# Patient Record
Sex: Female | Born: 1970 | Race: Black or African American | Hispanic: No | Marital: Single | Smoking: Current every day smoker
Health system: Southern US, Community
[De-identification: ages and names within clinical notes are randomized; demographics above are authoritative.]

## PROBLEM LIST (undated history)

## (undated) DIAGNOSIS — F329 Major depressive disorder, single episode, unspecified: Secondary | ICD-10-CM

## (undated) DIAGNOSIS — Z9189 Other specified personal risk factors, not elsewhere classified: Secondary | ICD-10-CM

## (undated) DIAGNOSIS — Q219 Congenital malformation of cardiac septum, unspecified: Secondary | ICD-10-CM

## (undated) DIAGNOSIS — F419 Anxiety disorder, unspecified: Secondary | ICD-10-CM

## (undated) DIAGNOSIS — F32A Depression, unspecified: Secondary | ICD-10-CM

## (undated) DIAGNOSIS — Z803 Family history of malignant neoplasm of breast: Secondary | ICD-10-CM

## (undated) DIAGNOSIS — K219 Gastro-esophageal reflux disease without esophagitis: Secondary | ICD-10-CM

## (undated) DIAGNOSIS — Z8041 Family history of malignant neoplasm of ovary: Secondary | ICD-10-CM

## (undated) DIAGNOSIS — Z1371 Encounter for nonprocreative screening for genetic disease carrier status: Secondary | ICD-10-CM

## (undated) DIAGNOSIS — D649 Anemia, unspecified: Secondary | ICD-10-CM

## (undated) DIAGNOSIS — IMO0001 Reserved for inherently not codable concepts without codable children: Secondary | ICD-10-CM

## (undated) DIAGNOSIS — G43909 Migraine, unspecified, not intractable, without status migrainosus: Secondary | ICD-10-CM

## (undated) HISTORY — PX: ENDOMETRIAL ABLATION: SHX621

## (undated) HISTORY — DX: Family history of malignant neoplasm of breast: Z80.3

## (undated) HISTORY — DX: Family history of malignant neoplasm of ovary: Z80.41

## (undated) HISTORY — DX: Anxiety disorder, unspecified: F41.9

## (undated) HISTORY — DX: Encounter for nonprocreative screening for genetic disease carrier status: Z13.71

## (undated) HISTORY — DX: Other specified personal risk factors, not elsewhere classified: Z91.89

## (undated) HISTORY — DX: Depression, unspecified: F32.A

## (undated) HISTORY — DX: Migraine, unspecified, not intractable, without status migrainosus: G43.909

## (undated) HISTORY — DX: Major depressive disorder, single episode, unspecified: F32.9

## (undated) HISTORY — DX: Congenital malformation of cardiac septum, unspecified: Q21.9

---

## 2005-08-09 HISTORY — PX: TUBAL LIGATION: SHX77

## 2006-04-28 ENCOUNTER — Ambulatory Visit: Payer: Self-pay | Admitting: Obstetrics & Gynecology

## 2008-04-03 ENCOUNTER — Ambulatory Visit: Payer: Self-pay | Admitting: Obstetrics & Gynecology

## 2010-07-22 ENCOUNTER — Emergency Department: Payer: Self-pay | Admitting: Emergency Medicine

## 2014-02-25 LAB — HM PAP SMEAR: HM PAP: ABNORMAL

## 2014-03-13 ENCOUNTER — Ambulatory Visit: Payer: Self-pay | Admitting: Obstetrics & Gynecology

## 2014-03-13 LAB — HM MAMMOGRAPHY: HM Mammogram: ABNORMAL — AB (ref 0–4)

## 2014-03-18 ENCOUNTER — Ambulatory Visit (INDEPENDENT_AMBULATORY_CARE_PROVIDER_SITE_OTHER): Payer: Medicaid Other | Admitting: General Surgery

## 2014-03-18 ENCOUNTER — Encounter: Payer: Self-pay | Admitting: General Surgery

## 2014-03-18 VITALS — BP 110/70 | HR 80 | Resp 12 | Ht 67.0 in | Wt 136.0 lb

## 2014-03-18 DIAGNOSIS — R920 Mammographic microcalcification found on diagnostic imaging of breast: Secondary | ICD-10-CM

## 2014-03-18 NOTE — Patient Instructions (Addendum)
Patient to be scheduled for left breast stereotactic breast biopsy. The patient is aware to call back for any questions or concerns.  Stereotactic Breast Biopsy A stereotactic breast biopsy is a procedure in which mammography is used in the collection of a sample of breast tissue. Mammography is a type of X-ray exam of the breasts that produces an image called a mammogram. The mammogram allows your health care provider to precisely locate the area of the breast from which a tissue sample will be taken. The tissue is then examined under a microscope to see if cancerous cells are present. A breast biopsy is done when:   A lump, abnormality, or mass is seen in the breast on a breast X-ray (mammogram).   Small calcium deposits (calcifications) are seen in the breast.   The shape or appearance of the breasts changes.   The shape or appearance of the nipples changes. You may have unusual or bloody discharge coming from the nipples, or you may have crusting, retraction, or dimpling of the nipples. A breast biopsy can indicate if you need surgery or other treatment.  LET [facility] CARE PROVIDER KNOW ABOUT:  Any allergies you have.  All medicines you are taking, including vitamins, herbs, eye drops, creams, and over-the-counter medicines.  Previous problems you or members of your family have had with the use of anesthetics.  Any blood disorders you have.  Previous surgeries you have had.  Medical conditions you have. RISKS AND COMPLICATIONS Generally, stereotactic breast biopsy is a safe procedure. However, as with any procedure, complications can occur. Possible complications include:  Infection at the needle-insertion site.   Bleeding or bruising after surgery.  The breast may become altered or deformed as a result of the procedure.  The needle may go through the chest wall into the lung area.  BEFORE THE PROCEDURE  Wear a supportive bra to the procedure.  You will be asked  to remove jewelry, dentures, eyeglasses, metal objects, or clothing that might interfere with the X-ray images. You may want to leave some of these objects at home.  Arrange for someone to drive you home after the procedure if desired. PROCEDURE  A stereotactic breast biopsy is done while you are awake. During the procedure, relax as much as possible. Let your health care provider know if you are uncomfortable, anxious, or in pain. Usually, the only discomfort felt during the procedure is caused by staying in one position for the length of the procedure. This discomfort can be reduced by carefully placed cushions. Most of the time the biopsy is done using a table with openings on it. You will be asked to lie facedown on the table and place your breasts through the openings. Your breast is compressed between metal plates to get good X-ray images. Your skin will be cleaned, and a numbing medicine (local anesthetic) will be injected. A small cut (incision) will be made in your breast. The tip of the biopsy needle will be directed through the incision. Several small pieces of suspicious tissue will be taken. Then, a final set of X-ray images will be obtained. If they show that the suspicious tissue has been mostly or completely removed, a small clip will be left at the biopsy site. This is done so that the biopsy site can be easily located if the results of the biopsy show that the tissue is cancerous.  After the procedure, the incision will be stitched (sutured) or taped and covered with a bandage (dressing). Your  health care provider may apply a pressure dressing and an ice pack to prevent bleeding and swelling in the breast.  A stereotactic breast biopsy can take 30 minutes or more. AFTER THE PROCEDURE  If you are doing well and have no problems, you will be allowed to go home.  Document Released: 04/24/2003 Document Revised: 07/31/2013 Document Reviewed: 02/22/2013 [facility] Patient Information 2015  [place], [place]. This information is not intended to replace advice given to you by your health care provider. Make sure you discuss any questions you have with your health care provider.   Patient to be scheduled for a left breast stereotactic biopsy at [facility]. She will be contacted by the staff to arrange a date.

## 2014-03-18 NOTE — Progress Notes (Signed)
Patient ID: Anita Nelson, female   DOB: 06/30/1971, 43 y.o.   MRN: [id number]  Chief Complaint  Patient presents with  . Other    mammogram    HPI Anita Nelson is a 43 y.o. female who presents for a breast evaluation. The most recent mammogram was done on 03/13/14. Patient does perform regular self breast checks but does not get regular mammograms done. Her last mammogram was in 2009. The patient denies any problems with the breasts.  The patient's mother was diagnosed with breast cancer 20 years ago at age 69. Her mother is alive and well. Testing for BRCA was not available at the time of her mother's original diagnosis. The patient's last mammogram was 6+ years ago.  HPI  Past Medical History  Diagnosis Date  . Migraine   . Anxiety   . Depression     Past Surgical History  Procedure Laterality Date  . Tubal ligation  2007    Family History  Problem Relation Age of Onset  . Cancer Mother 55    breast    Social History History  Substance Use Topics  . Smoking status: Current Every Day Smoker -- 1.00 packs/day for 25 years    Types: Cigarettes  . Smokeless tobacco: Never Used  . Alcohol Use: No    No Known Allergies  Current Outpatient Prescriptions  Medication Sig Dispense Refill  . ALPRAZolam (XANAX) 0.25 MG tablet as needed.      . citalopram (CELEXA) 40 MG tablet Take 40 mg by mouth daily.      . SUMAtriptan (IMITREX) 100 MG tablet Take by mouth.      . topiramate (TOPAMAX) 25 MG tablet Take 1 tablet by mouth as needed.       No current facility-administered medications for this visit.    Review of Systems Review of Systems  Constitutional: Negative.   Respiratory: Negative.   Cardiovascular: Negative.     Blood pressure 110/70, pulse 80, resp. rate 12, height 5' 7"  (1.702 m), weight 136 lb (61.689 kg), last menstrual period 02/20/2014.  Physical Exam Physical Exam  Constitutional: She is oriented to person, place, and time. She appears  well-developed and well-nourished.  Neck: Neck supple. No thyromegaly present.  Cardiovascular: Normal rate, regular rhythm and normal heart sounds.   No murmur heard. Pulmonary/Chest: Effort normal and breath sounds normal. Right breast exhibits no inverted nipple, no mass, no nipple discharge, no skin change and no tenderness. Left breast exhibits no inverted nipple, no mass, no nipple discharge, no skin change and no tenderness.  Lymphadenopathy:    She has no cervical adenopathy.    She has no axillary adenopathy.  Neurological: She is alert and oriented to person, place, and time.  Skin: Skin is warm and dry.    Data Reviewed Screening mammograms completed February 25, 2014 a [facility] showed a new cluster of calcifications behind the nipple in the left breast slightly medial and inferiorly located. Additional views requested. BI-RAD-0.  Focal spot compression views of the left breast in March 13, 2014 showed a 3 mm area of "slightly suspicious subareolar left breast calcifications".  The films were reviewed and a small area of microcalcifications or superficially located in the retroareolar area of the left breast. These may be difficult to biopsy with stereotactic technique, but it is reasonable to make an attempt.  If stereotactic biopsy is not possible wire localization biopsyWould be appropriate. This procedure was discussed with the patient.  Assessment  Left breast microcalcifications.     Plan    This is essentially a new screening exam after 6 years. Were not for the patient's family history I would recommend observation with a repeat exam in 6 months. Considering her mother's diagnosis of breast cancer the same age this would not be well tolerated.  The patient was encouraged to see if her mother would be interested in BRCA testing at this time. If her mother is negative and the patient's upcoming biopsy is unremarkable no additional intervention would be required. If  however the mother was BRCA positive patient testing would be appropriate especially of malignancy was identified.  Patient to be scheduled for a left breast stereotactic biopsy at [facility]. She will be contacted by the staff to arrange a date.      PCP: No Pcp Per Patient Ref. MD: Teresa Coombs MD  Anita Nelson 03/18/2014, 10:00 PM

## 2014-03-19 ENCOUNTER — Telehealth: Payer: Self-pay

## 2014-03-19 NOTE — Telephone Encounter (Signed)
I spoke with patient about scheduling her stereo biopsy of the left breast. Patient has been scheduled for a left breast stereotactic biopsy at [facility] for 03/25/14 at 3:00 pm. She will check-in at the [facility] at 2:30 pm. This patient is aware of date, time, and instructions. Patient verbalizes understanding.

## 2014-03-25 ENCOUNTER — Ambulatory Visit: Payer: Self-pay | Admitting: General Surgery

## 2014-03-25 ENCOUNTER — Telehealth: Payer: Self-pay | Admitting: *Deleted

## 2014-03-25 DIAGNOSIS — R920 Mammographic microcalcification found on diagnostic imaging of breast: Secondary | ICD-10-CM

## 2014-03-25 HISTORY — PX: BREAST BIOPSY: SHX20

## 2014-03-25 NOTE — Telephone Encounter (Signed)
Message copied by Dominga Ferry on Mon Mar 25, 2014  5:47 PM ------      Message from: [place], [place] W      Created: Mon Mar 25, 2014  4:12 PM       I will need to see the patient in one month.  (Post stereo biopsy today).  ------

## 2014-03-25 NOTE — Telephone Encounter (Signed)
Patient coming for follow up appointment on 04-29-14. She is aware of date, time, and instructions.

## 2014-03-26 ENCOUNTER — Telehealth: Payer: Self-pay | Admitting: *Deleted

## 2014-03-26 ENCOUNTER — Encounter: Payer: Self-pay | Admitting: General Surgery

## 2014-03-26 NOTE — Telephone Encounter (Signed)
Message copied by Carson Myrtle on Tue Mar 26, 2014  1:28 PM ------      Message from: [place], Forest Gleason      Created: Tue Mar 26, 2014  1:11 PM       Please notify yesterday's breast biopsy report was fine.  No cancer.       Calcium deposits seen on slides.      F/U next week as planned (to see me).  ------

## 2014-03-26 NOTE — Telephone Encounter (Signed)
I talked with the patient and she states she is getting along well. She has already taken the dressing off and there is a small amount of bruising noted. She did say she felt a little "funny", weak, after her shower but she is feels ok now. F/U appt for Monday at 2:00, pt agrees.

## 2014-03-26 NOTE — Telephone Encounter (Signed)
Message copied by Carson Myrtle on Tue Mar 26, 2014  8:38 AM ------      Message from: [place], Forest Gleason      Created: Tue Mar 26, 2014  8:12 AM       Contact patient to see how she did after yesterday's stereo biopsy. Moderate bleeding requiring pressure. I need to see her for post biopsy follow up next week instead of staff.  ------

## 2014-03-26 NOTE — Telephone Encounter (Signed)
Notified patient as instructed, patient pleased. Discussed follow-up appointments, patient agrees  

## 2014-03-27 LAB — PATHOLOGY REPORT

## 2014-03-28 ENCOUNTER — Ambulatory Visit: Payer: Self-pay | Admitting: General Surgery

## 2014-03-28 ENCOUNTER — Encounter: Payer: Self-pay | Admitting: General Surgery

## 2014-04-01 ENCOUNTER — Ambulatory Visit (INDEPENDENT_AMBULATORY_CARE_PROVIDER_SITE_OTHER): Payer: Self-pay | Admitting: General Surgery

## 2014-04-01 ENCOUNTER — Encounter: Payer: Self-pay | Admitting: General Surgery

## 2014-04-01 VITALS — BP 82/58 | HR 84 | Resp 12 | Ht 67.0 in | Wt 137.0 lb

## 2014-04-01 DIAGNOSIS — R920 Mammographic microcalcification found on diagnostic imaging of breast: Secondary | ICD-10-CM

## 2014-04-01 NOTE — Progress Notes (Addendum)
Patient ID: Anita Nelson, female   DOB: 1970/09/19, 43 y.o.   MRN: [id number]  Chief Complaint  Patient presents with  . Follow-up    left breast stereo    HPI Anita Nelson is a 43 y.o. female here today for a follow up left breast stereo done on 03/25/14. The patient denies any new problems with her breasts at this time. The patient does states she had an episode the day after her biopsy where she felt like she was going to faint so she laid down on the kitchen floor until the episode passed. No other episodes since then.   HPI  Past Medical History  Diagnosis Date  . Migraine   . Anxiety   . Depression     Past Surgical History  Procedure Laterality Date  . Tubal ligation  2007  . Breast biopsy Right 2015    Family History  Problem Relation Age of Onset  . Cancer Mother 72    breast    Social History History  Substance Use Topics  . Smoking status: Current Every Day Smoker -- 1.00 packs/day for 25 years    Types: Cigarettes  . Smokeless tobacco: Never Used  . Alcohol Use: No    No Known Allergies  Current Outpatient Prescriptions  Medication Sig Dispense Refill  . ALPRAZolam (XANAX) 0.25 MG tablet as needed.      . citalopram (CELEXA) 40 MG tablet Take 40 mg by mouth daily.      . SUMAtriptan (IMITREX) 100 MG tablet Take by mouth.      . topiramate (TOPAMAX) 25 MG tablet Take 1 tablet by mouth as needed.       No current facility-administered medications for this visit.    Review of Systems Review of Systems  Constitutional: Negative.   Respiratory: Negative.   Cardiovascular: Negative.     Blood pressure 82/58, pulse 84, resp. rate 12, height 5' 7"  (1.702 m), weight 137 lb (62.143 kg), last menstrual period 02/20/2014.  Physical Exam Physical Exam  Constitutional: She is oriented to person, place, and time. She appears well-developed and well-nourished.  Neurological: She is alert and oriented to person, place, and time.  Skin: Skin is warm and  dry.  Examination of the left breast shows minimal bruising, surprising considering the amount of bleeding present during the procedure and the time it took to achieve hemostasis with direct pressure post biopsy.  Data Reviewed Stromal fibrosis and benign mammary gland epithelium was scattered microcalcifications.  Assessment    Benign breast biopsy.     Plan    At the time of the procedure, calcifications could not be identified and resected tissue, nor in the breast on postbiopsy imaging. With the identification of calcifications on the recently completed biopsy, will plan for a followup left breast mammogram in 6 months (rather than in one month as originally discussed with the patient last week).      PCP: No Pcp Per     Robert Bellow 04/03/2014, 10:00 PM

## 2014-04-01 NOTE — Patient Instructions (Signed)
Patient to return in 6 month with a left breast mammogram. Continue self breast exams. Call office for any new breast issues or concerns.

## 2014-04-29 ENCOUNTER — Ambulatory Visit: Payer: Medicaid Other | Admitting: General Surgery

## 2014-06-10 ENCOUNTER — Encounter: Payer: Self-pay | Admitting: General Surgery

## 2014-08-09 DIAGNOSIS — Q219 Congenital malformation of cardiac septum, unspecified: Secondary | ICD-10-CM

## 2014-08-09 HISTORY — DX: Congenital malformation of cardiac septum, unspecified: Q21.9

## 2014-08-09 HISTORY — PX: BREAST EXCISIONAL BIOPSY: SUR124

## 2014-10-01 ENCOUNTER — Ambulatory Visit: Payer: Medicaid Other | Admitting: General Surgery

## 2014-11-22 ENCOUNTER — Ambulatory Visit
Admit: 2014-11-22 | Disposition: A | Discharge status: Home / Self Care | Payer: Self-pay | Attending: General Surgery | Admitting: General Surgery

## 2014-11-25 ENCOUNTER — Encounter: Payer: Self-pay | Admitting: General Surgery

## 2014-11-30 NOTE — Op Note (Signed)
PATIENT NAME:  Anita Nelson, Anita Nelson MR#:  [id number] DATE OF BIRTH:  1971/07/26  DATE OF PROCEDURE:  03/25/2014  PREOPERATIVE DIAGNOSIS: Left breast microcalcifications.   POSTOPERATIVE DIAGNOSIS:  Left breast microcalcifications.   OPERATIVE PROCEDURE: Stereotactic biopsy, left breast.   OPERATING SURGEON: Robert Bellow, MD  ANESTHESIA: Xylocaine 1%, 15 mL, with 1:200,000 units of epinephrine.   ESTIMATED BLOOD LOSS: 10 mL.   CLINICAL NOTE: This 44 year old woman had screening mammogram showing a small area of microcalcifications in the left breast in the retroareolar area. Focal spot compression views showed a 3 mm area of "slightly suspicious subareolar left breast calcifications."  As her mother had breast cancer at age 61, she was anxious and it was planned to proceed to a stereotactic biopsy.   DESCRIPTION OF PROCEDURE: The patient was placed prone on the stereotactic table and from a medial approach, images obtained. The breast did not fill the volume of the biopsy window. The site of calcifications were at the very inferior aspect of the biopsy window. Stereotactic paired images were obtained and an appropriate target with a petit needle planned. After the instillation of local anesthetic, it appeared that the calcifications had moved cephalad. These were retargeted and the area was far less distinct; 12 core samples were obtained. Modest bleeding was noted and controlled with direct pressure. Specimen radiograph did not confirm the calcifications. Re-imaging of the breast with the biopsy needle removed did not show any clear area of calcifications. At this point, it was elected to terminate the procedure. A clip was not placed. As it took approximately 10 minutes with direct pressure by the physician for bleeding control, it was elected not to complete a post biopsy mammogram.   Arrangements will be made for a wound check in 1 week and then reassessment with repeat mammogram of the left  breast in 1 month.     ____________________________ Robert Bellow, MD jwb:LT D: 03/25/2014 16:12:46 ET T: 03/25/2014 18:01:10 ET JOB#: [id number]  cc: Robert Bellow, MD, <Dictator> R. Barnett Applebaum, MD Fallou Hulbert Amedeo Kinsman MD ELECTRONICALLY SIGNED 03/26/2014 11:02

## 2014-12-04 ENCOUNTER — Ambulatory Visit: Payer: Medicaid Other | Admitting: General Surgery

## 2014-12-10 ENCOUNTER — Encounter: Payer: Self-pay | Admitting: General Surgery

## 2014-12-10 ENCOUNTER — Ambulatory Visit (INDEPENDENT_AMBULATORY_CARE_PROVIDER_SITE_OTHER): Payer: 59 | Admitting: General Surgery

## 2014-12-10 VITALS — BP 98/62 | HR 78 | Resp 14 | Ht 67.0 in | Wt 148.0 lb

## 2014-12-10 DIAGNOSIS — R920 Mammographic microcalcification found on diagnostic imaging of breast: Secondary | ICD-10-CM

## 2014-12-10 NOTE — Patient Instructions (Signed)
The patient is aware to call back for any questions or concerns.  

## 2014-12-10 NOTE — Progress Notes (Signed)
Patient ID: Anita Nelson, female   DOB: Nov 13, 1970, 44 y.o.   MRN: [id number]  Chief Complaint  Patient presents with  . Follow-up    mammogram    HPI Anita Nelson is a 44 y.o. female who presents for a breast evaluation. The most recent mammogram was done on 11/22/14.  Patient does perform regular self breast checks and gets regular mammograms done.   No new breast issues. 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  HPI  Past Medical History  Diagnosis Date  . Migraine   . Anxiety   . Depression   . Septal defect, heart 2016    Dr. Saralyn Pilar septal hypokinesis    Past Surgical History  Procedure Laterality Date  . Tubal ligation  2007  . Breast biopsy Right 03-25-14    Stromal fibrosis and benign calcifications    Family History  Problem Relation Age of Onset  . Cancer Mother 80    breast    Social History History  Substance Use Topics  . Smoking status: Current Every Day Smoker -- 1.00 packs/day for 25 years    Types: Cigarettes  . Smokeless tobacco: Never Used  . Alcohol Use: No    No Known Allergies  Current Outpatient Prescriptions  Medication Sig Dispense Refill  . amitriptyline (ELAVIL) 25 MG tablet Take 25 mg by mouth at bedtime.    . citalopram (CELEXA) 40 MG tablet Take 40 mg by mouth daily.    Marland Kitchen ibuprofen (ADVIL,MOTRIN) 800 MG tablet Take 800 mg by mouth every 8 (eight) hours as needed.    . SUMAtriptan (IMITREX) 100 MG tablet Take by mouth every 2 (two) hours as needed.      No current facility-administered medications for this visit.    Review of Systems Review of Systems  Constitutional: Negative.   Respiratory: Positive for shortness of breath.   Cardiovascular: Negative.     Blood pressure 98/62, pulse 78, resp. rate 14, height 5' 7"  (1.702 m), weight 67.132 kg (148 lb), last menstrual period 11/21/2014.  Physical Exam Physical Exam  Constitutional: She is oriented to person, place, and time. She appears  well-developed and well-nourished.  Neck: Neck supple.  Cardiovascular: Normal rate, regular rhythm and normal heart sounds.   Pulmonary/Chest: Breath sounds normal. Right breast exhibits no inverted nipple, no mass, no nipple discharge, no skin change and no tenderness. Left breast exhibits no inverted nipple, no mass, no nipple discharge, no skin change and no tenderness.  Lymphadenopathy:    She has no cervical adenopathy.    She has no axillary adenopathy.  Neurological: She is alert and oriented to person, place, and time.  Skin: Skin is warm and dry.    Data Reviewed PATHOLOGY REPORT  Pathology Report  .                [  Final Report     ]           Material submitted:                    Marland Kitchen  LEFT BREAST  .                [  Final Report     ]           Pre-operative diagnosis:                    .  LEFT BREAST CALCS  .                [  Final Report     ]           Diagnosis:  LEFT BREAST:  - STROMAL FIBROSIS AND BENIGN MAMMARY EPITHELIUM WITH SCATTERED  MICROCALCIFICATIONS.  Marland Kitchen  NOTE: The results of this case were discussed with Dr. Bary Castilla on  March 26, 2014 by Dr. Luana Shu.  XDB/03/26/2014   11/22/2014 left breast mammogram showed persistence of the small area of microcalcifications previously targeted for stereotactic biopsy. BI-RADS-4.   Assessment    The role for needle localization biopsy considering the marked difficulty with the previous stereotactic procedure was reviewed. This will need to be deferred pending her completion of cardiology evaluation.    Plan    The patient will contact the office after she completes her cardiac evaluation and will work on scheduling an outpatient procedure under general or local anesthesia with sedation as recommended by her cardiologist.     Discussed risk and benefits of left breast biopsy with needle  localization.   PCP:  Sparks,Jeffrey  Dr. Earney Mallet, Forest Gleason 12/11/2014, 6:49 AM

## 2015-01-24 ENCOUNTER — Encounter: Payer: Self-pay | Admitting: General Surgery

## 2015-01-24 NOTE — Progress Notes (Unsigned)
Cardiology clearance has been received from Miquel Dunn, M.D. We'll schedule the patient for a needle localization for microcalcifications in the left breast.

## 2015-01-27 ENCOUNTER — Encounter: Payer: Self-pay | Admitting: General Surgery

## 2015-01-27 ENCOUNTER — Other Ambulatory Visit: Payer: Self-pay | Admitting: General Surgery

## 2015-01-27 NOTE — H&P (Signed)
Progress Notes Info    Author Note Status Last Update User Last Update Date/Time   Robert Bellow, MD Signed Robert Bellow, MD 12/11/2014 6:51 AM    Progress Notes    Expand All Collapse All   Patient ID: Anita Nelson, female DOB: [birth year], 44 y.o. MRN: [id number]  Chief Complaint  Patient presents with  . Follow-up    mammogram    HPI Anita Nelson is a 44 y.o. female who presents for a breast evaluation. The most recent mammogram was done on 11/22/14.  Patient does perform regular self breast checks and gets regular mammograms done.  No new breast issues. 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  HPI  Past Medical History  Diagnosis Date  . Migraine   . Anxiety   . Depression   . Septal defect, heart 2016    Dr. Saralyn Pilar septal hypokinesis    Past Surgical History  Procedure Laterality Date  . Tubal ligation  2007  . Breast biopsy Right 03-25-14    Stromal fibrosis and benign calcifications    Family History  Problem Relation Age of Onset  . Cancer Mother 90    breast    Social History History  Substance Use Topics  . Smoking status: Current Every Day Smoker -- 1.00 packs/day for 25 years    Types: Cigarettes  . Smokeless tobacco: Never Used  . Alcohol Use: No    No Known Allergies  Current Outpatient Prescriptions  Medication Sig Dispense Refill  . amitriptyline (ELAVIL) 25 MG tablet Take 25 mg by mouth at bedtime.    . citalopram (CELEXA) 40 MG tablet Take 40 mg by mouth daily.    Marland Kitchen ibuprofen (ADVIL,MOTRIN) 800 MG tablet Take 800 mg by mouth every 8 (eight) hours as needed.    . SUMAtriptan (IMITREX) 100 MG tablet Take by mouth every 2 (two) hours as needed.      No current facility-administered medications for this visit.    Review of Systems Review of Systems  Constitutional: Negative.   Respiratory: Positive for shortness of breath.  Cardiovascular: Negative.    Blood pressure 98/62, pulse 78, resp. rate 14, height 5' 7"  (1.702 m), weight 67.132 kg (148 lb), last menstrual period 11/21/2014.  Physical Exam Physical Exam  Constitutional: She is oriented to person, place, and time. She appears well-developed and well-nourished.  Neck: Neck supple.  Cardiovascular: Normal rate, regular rhythm and normal heart sounds.  Pulmonary/Chest: Breath sounds normal. Right breast exhibits no inverted nipple, no mass, no nipple discharge, no skin change and no tenderness. Left breast exhibits no inverted nipple, no mass, no nipple discharge, no skin change and no tenderness.  Lymphadenopathy:   She has no cervical adenopathy.   She has no axillary adenopathy.  Neurological: She is alert and oriented to person, place, and time.  Skin: Skin is warm and dry.    Data Reviewed PATHOLOGY REPORT  Pathology Report  .                [  Final Report     ]           Material submitted:                    Marland Kitchen  LEFT BREAST  .                [  Final Report     ]  Pre-operative diagnosis:                    .  LEFT BREAST CALCS  .                [  Final Report     ]           Diagnosis:  LEFT BREAST:  - STROMAL FIBROSIS AND BENIGN MAMMARY EPITHELIUM WITH SCATTERED  MICROCALCIFICATIONS.  Marland Kitchen  NOTE: The results of this case were discussed with Dr. Bary Castilla on  March 26, 2014 by Dr. Luana Shu.  XDB/03/26/2014   11/22/2014 left breast mammogram showed persistence of the small area of microcalcifications previously targeted for stereotactic biopsy. BI-RADS-4.   Assessment    The role for needle localization biopsy considering the marked difficulty with the previous stereotactic procedure was reviewed. This will need to be deferred pending her completion of  cardiology evaluation.    Plan    The patient will contact the office after she completes her cardiac evaluation and will work on scheduling an outpatient procedure under general or local anesthesia with sedation as recommended by her cardiologist.     Discussed risk and benefits of left breast biopsy with needle localization.    Robert Bellow   Cardiology clearance has been obtained from Scarlette Shorts, M.D.

## 2015-01-28 ENCOUNTER — Other Ambulatory Visit: Payer: Self-pay | Admitting: General Surgery

## 2015-01-28 DIAGNOSIS — R921 Mammographic calcification found on diagnostic imaging of breast: Secondary | ICD-10-CM

## 2015-01-28 DIAGNOSIS — R928 Other abnormal and inconclusive findings on diagnostic imaging of breast: Secondary | ICD-10-CM

## 2015-01-29 ENCOUNTER — Ambulatory Visit: Payer: 59

## 2015-02-21 ENCOUNTER — Encounter: Payer: Self-pay | Admitting: *Deleted

## 2015-02-21 ENCOUNTER — Other Ambulatory Visit: Payer: Self-pay

## 2015-02-21 DIAGNOSIS — F329 Major depressive disorder, single episode, unspecified: Secondary | ICD-10-CM | POA: Diagnosis not present

## 2015-02-21 DIAGNOSIS — F419 Anxiety disorder, unspecified: Secondary | ICD-10-CM | POA: Diagnosis not present

## 2015-02-21 DIAGNOSIS — Z79899 Other long term (current) drug therapy: Secondary | ICD-10-CM | POA: Diagnosis not present

## 2015-02-21 DIAGNOSIS — D242 Benign neoplasm of left breast: Secondary | ICD-10-CM | POA: Diagnosis not present

## 2015-02-21 DIAGNOSIS — F17210 Nicotine dependence, cigarettes, uncomplicated: Secondary | ICD-10-CM | POA: Diagnosis not present

## 2015-02-21 DIAGNOSIS — Q219 Congenital malformation of cardiac septum, unspecified: Secondary | ICD-10-CM | POA: Diagnosis not present

## 2015-02-21 DIAGNOSIS — G43909 Migraine, unspecified, not intractable, without status migrainosus: Secondary | ICD-10-CM | POA: Diagnosis not present

## 2015-02-21 DIAGNOSIS — N649 Disorder of breast, unspecified: Secondary | ICD-10-CM | POA: Diagnosis present

## 2015-02-21 DIAGNOSIS — Z803 Family history of malignant neoplasm of breast: Secondary | ICD-10-CM | POA: Diagnosis not present

## 2015-02-21 NOTE — Patient Instructions (Signed)
  Your procedure is scheduled on: 02-28-15 Report to [facility] AT 8 AM   Remember: Instructions that are not followed completely may result in serious medical risk, up to and including death, or upon the discretion of your surgeon and anesthesiologist your surgery may need to be rescheduled.    __X__ 1. Do not eat food or drink liquids after midnight. No gum chewing or hard candies.     _X___ 2. No Alcohol for 24 hours before or after surgery.   ____ 3. Bring all medications with you on the day of surgery if instructed.    ____ 4. Notify your doctor if there is any change in your medical condition     (cold, fever, infections).     Do not wear jewelry, make-up, hairpins, clips or nail polish.  Do not wear lotions, powders, or perfumes. You may wear deodorant.  Do not shave 48 hours prior to surgery. Men may shave face and neck.  Do not bring valuables to the hospital.    [facility] is not responsible for any belongings or valuables.               Contacts, dentures or bridgework may not be worn into surgery.  Leave your suitcase in the car. After surgery it may be brought to your room.  For patients admitted to the hospital, discharge time is determined by your   treatment team.   Patients discharged the day of surgery will not be allowed to drive home.   Please read over the following fact sheets that you were given:      ____ Take these medicines the morning of surgery with A SIP OF WATER:    1. NONE  2.   3.   4.  5.  6.  ____ Fleet Enema (as directed)   ____ Use CHG Soap as directed  ____ Use inhalers on the day of surgery  ____ Stop metformin 2 days prior to surgery    ____ Take 1/2 of usual insulin dose the night before surgery and none on the morning of surgery.   ____ Stop Coumadin/Plavix/aspirin-N/A  ____ Stop Anti-inflammatories-STOP IBUPROFEN NOW-NO NSAIDS OR ASA PRODUCTS-TYLENOL OK   ____ Stop supplements until after surgery.    ____  Bring C-Pap to the hospital.

## 2015-02-28 ENCOUNTER — Encounter: Payer: Self-pay | Admitting: *Deleted

## 2015-02-28 ENCOUNTER — Ambulatory Visit
Admission: RE | Admit: 2015-02-28 | Discharge: 2015-02-28 | Disposition: A | Discharge status: Home / Self Care | Payer: 59 | Source: Ambulatory Visit | Attending: General Surgery | Admitting: General Surgery

## 2015-02-28 ENCOUNTER — Ambulatory Visit: Payer: 59 | Admitting: *Deleted

## 2015-02-28 ENCOUNTER — Encounter
Admission: RE | Disposition: A | Discharge status: Home / Self Care | Payer: Self-pay | Source: Ambulatory Visit | Attending: General Surgery

## 2015-02-28 DIAGNOSIS — F419 Anxiety disorder, unspecified: Secondary | ICD-10-CM | POA: Insufficient documentation

## 2015-02-28 DIAGNOSIS — G43909 Migraine, unspecified, not intractable, without status migrainosus: Secondary | ICD-10-CM | POA: Insufficient documentation

## 2015-02-28 DIAGNOSIS — R921 Mammographic calcification found on diagnostic imaging of breast: Secondary | ICD-10-CM

## 2015-02-28 DIAGNOSIS — R928 Other abnormal and inconclusive findings on diagnostic imaging of breast: Secondary | ICD-10-CM

## 2015-02-28 DIAGNOSIS — Z79899 Other long term (current) drug therapy: Secondary | ICD-10-CM | POA: Insufficient documentation

## 2015-02-28 DIAGNOSIS — D242 Benign neoplasm of left breast: Secondary | ICD-10-CM | POA: Insufficient documentation

## 2015-02-28 DIAGNOSIS — F17210 Nicotine dependence, cigarettes, uncomplicated: Secondary | ICD-10-CM | POA: Insufficient documentation

## 2015-02-28 DIAGNOSIS — F329 Major depressive disorder, single episode, unspecified: Secondary | ICD-10-CM | POA: Insufficient documentation

## 2015-02-28 DIAGNOSIS — Z803 Family history of malignant neoplasm of breast: Secondary | ICD-10-CM | POA: Insufficient documentation

## 2015-02-28 DIAGNOSIS — Q219 Congenital malformation of cardiac septum, unspecified: Secondary | ICD-10-CM | POA: Insufficient documentation

## 2015-02-28 DIAGNOSIS — R920 Mammographic microcalcification found on diagnostic imaging of breast: Secondary | ICD-10-CM | POA: Diagnosis not present

## 2015-02-28 HISTORY — DX: Reserved for inherently not codable concepts without codable children: IMO0001

## 2015-02-28 HISTORY — PX: BREAST BIOPSY: SHX20

## 2015-02-28 HISTORY — DX: Anemia, unspecified: D64.9

## 2015-02-28 HISTORY — DX: Gastro-esophageal reflux disease without esophagitis: K21.9

## 2015-02-28 SURGERY — BREAST BIOPSY WITH NEEDLE LOCALIZATION
Anesthesia: General | Laterality: Left

## 2015-02-28 MED ORDER — ONDANSETRON HCL 4 MG/2ML IJ SOLN: 4.0000 mg | Freq: Once | INTRAMUSCULAR | Status: DC | PRN

## 2015-02-28 MED ORDER — BUPIVACAINE-EPINEPHRINE (PF) 0.5% -1:200000 IJ SOLN
INTRAMUSCULAR | Status: DC | PRN
  Administered 2015-02-28: 20 mL

## 2015-02-28 MED ORDER — HYDROCODONE-ACETAMINOPHEN 5-325 MG PO TABS: 1.0000 | ORAL_TABLET | ORAL | Status: DC | PRN

## 2015-02-28 MED ORDER — GLYCOPYRROLATE 0.2 MG/ML IJ SOLN
INTRAMUSCULAR | Status: DC | PRN
  Administered 2015-02-28: 0.2 mg via INTRAVENOUS

## 2015-02-28 MED ORDER — DEXAMETHASONE SODIUM PHOSPHATE 4 MG/ML IJ SOLN
INTRAMUSCULAR | Status: DC | PRN
  Administered 2015-02-28: 10 mg via INTRAVENOUS

## 2015-02-28 MED ORDER — ONDANSETRON HCL 4 MG/2ML IJ SOLN
INTRAMUSCULAR | Status: DC | PRN
  Administered 2015-02-28: 4 mg via INTRAVENOUS

## 2015-02-28 MED ORDER — PROPOFOL 10 MG/ML IV BOLUS
INTRAVENOUS | Status: DC | PRN
  Administered 2015-02-28: 200 mg via INTRAVENOUS

## 2015-02-28 MED ORDER — FAMOTIDINE 20 MG PO TABS
20.0000 mg | ORAL_TABLET | Freq: Once | ORAL | Status: AC
  Administered 2015-02-28: 20 mg via ORAL

## 2015-02-28 MED ORDER — HYDROMORPHONE HCL 1 MG/ML IJ SOLN: 0.2500 mg | INTRAMUSCULAR | Status: DC | PRN

## 2015-02-28 MED ORDER — FENTANYL CITRATE (PF) 100 MCG/2ML IJ SOLN
INTRAMUSCULAR | Status: DC | PRN
  Administered 2015-02-28 (×2): 50 ug via INTRAVENOUS

## 2015-02-28 MED ORDER — MIDAZOLAM HCL 2 MG/2ML IJ SOLN
INTRAMUSCULAR | Status: DC | PRN
  Administered 2015-02-28: 2 mg via INTRAVENOUS

## 2015-02-28 MED ORDER — LIDOCAINE HCL (CARDIAC) 20 MG/ML IV SOLN
INTRAVENOUS | Status: DC | PRN
  Administered 2015-02-28: 100 mg via INTRAVENOUS

## 2015-02-28 MED ORDER — DEXMEDETOMIDINE HCL 200 MCG/2ML IV SOLN
INTRAVENOUS | Status: DC | PRN
  Administered 2015-02-28: 8 ug via INTRAVENOUS

## 2015-02-28 MED ORDER — BUPIVACAINE-EPINEPHRINE (PF) 0.5% -1:200000 IJ SOLN
INTRAMUSCULAR | Status: AC
  Filled 2015-02-28: qty 30

## 2015-02-28 MED ORDER — FAMOTIDINE 20 MG PO TABS
ORAL_TABLET | ORAL | Status: AC
  Administered 2015-02-28: 20 mg via ORAL
  Filled 2015-02-28: qty 1

## 2015-02-28 MED ORDER — LACTATED RINGERS IV SOLN
INTRAVENOUS | Status: DC
  Administered 2015-02-28: 50 mL/h via INTRAVENOUS

## 2015-02-28 SURGICAL SUPPLY — 38 items
BANDAGE ELASTIC 6 CLIP NS LF (GAUZE/BANDAGES/DRESSINGS) IMPLANT
BENZOIN TINCTURE PRP APPL 2/3 (GAUZE/BANDAGES/DRESSINGS) ×2 IMPLANT
BLADE SURG 15 STRL SS SAFETY (BLADE) ×2 IMPLANT
BNDG GAUZE 4.5X4.1 6PLY STRL (MISCELLANEOUS) IMPLANT
CANISTER SUCT 1200ML W/VALVE (MISCELLANEOUS) ×2 IMPLANT
CHLORAPREP W/TINT 26ML (MISCELLANEOUS) ×2 IMPLANT
CNTNR SPEC 2.5X3XGRAD LEK (MISCELLANEOUS)
CONT SPEC 4OZ STER OR WHT (MISCELLANEOUS)
CONTAINER SPEC 2.5X3XGRAD LEK (MISCELLANEOUS) IMPLANT
DEVICE DUBIN SPECIMEN MAMMOGRA (MISCELLANEOUS) ×2 IMPLANT
DRAPE LAPAROTOMY 100X77 ABD (DRAPES) ×2 IMPLANT
DRESSING TELFA 4X3 1S ST N-ADH (GAUZE/BANDAGES/DRESSINGS) ×2 IMPLANT
DRSG TEGADERM 4X4.75 (GAUZE/BANDAGES/DRESSINGS) ×2 IMPLANT
DRSG TELFA 3X8 NADH (GAUZE/BANDAGES/DRESSINGS) ×2 IMPLANT
ELECT CAUTERY BLADE TIP 2.5 (TIP) ×2
ELECTRODE CAUTERY BLDE TIP 2.5 (TIP) ×1 IMPLANT
GAUZE FLUFF 18X24 1PLY STRL (GAUZE/BANDAGES/DRESSINGS) IMPLANT
GLOVE BIO SURGEON STRL SZ7.5 (GLOVE) ×6 IMPLANT
GLOVE INDICATOR 8.0 STRL GRN (GLOVE) ×6 IMPLANT
GOWN STRL REUS W/ TWL LRG LVL3 (GOWN DISPOSABLE) ×3 IMPLANT
GOWN STRL REUS W/TWL LRG LVL3 (GOWN DISPOSABLE) ×3
KIT RM TURNOVER STRD PROC AR (KITS) ×2 IMPLANT
LABEL OR SOLS (LABEL) IMPLANT
MARGIN MAP 10MM (MISCELLANEOUS) ×2 IMPLANT
NDL SAFETY 22GX1.5 (NEEDLE) ×2 IMPLANT
NDL SAFETY 25GX1.5 (NEEDLE) IMPLANT
PACK BASIN MINOR ARMC (MISCELLANEOUS) ×2 IMPLANT
PAD GROUND ADULT SPLIT (MISCELLANEOUS) ×2 IMPLANT
STRIP CLOSURE SKIN 1/2X4 (GAUZE/BANDAGES/DRESSINGS) ×2 IMPLANT
SUT ETHILON 3-0 FS-10 30 BLK (SUTURE) ×2
SUT VIC AB 2-0 CT1 27 (SUTURE) ×1
SUT VIC AB 2-0 CT1 TAPERPNT 27 (SUTURE) ×1 IMPLANT
SUT VIC AB 4-0 FS2 27 (SUTURE) ×2 IMPLANT
SUTURE EHLN 3-0 FS-10 30 BLK (SUTURE) ×1 IMPLANT
SWABSTK COMLB BENZOIN TINCTURE (MISCELLANEOUS) ×2 IMPLANT
SYR CONTROL 10ML (SYRINGE) ×2 IMPLANT
TAPE TRANSPORE STRL 2 31045 (GAUZE/BANDAGES/DRESSINGS) IMPLANT
WATER STERILE IRR 1000ML POUR (IV SOLUTION) ×2 IMPLANT

## 2015-02-28 NOTE — H&P (Signed)
Healthy since last visit. Cardiology evaluation last month. Left breast microcalcifications for biopsy.  HEENT: Neg. Lungs: Clear. Cardio: RR. Left breast w/ localizing wire in place.

## 2015-02-28 NOTE — Transfer of Care (Signed)
Immediate Anesthesia Transfer of Care Note  Patient: Anita Nelson  Procedure(s) Performed: Procedure(s): BREAST BIOPSY WITH NEEDLE LOCALIZATION (Left)  Patient Location: PACU  Anesthesia Type:General  Level of Consciousness: Alert, Awake, Oriented  Airway & Oxygen Therapy: Patient Spontanous Breathing  Post-op Assessment: Report given to RN  Post vital signs: Reviewed and stable  Last Vitals:  Filed Vitals:   02/28/15 1133  BP: 98/63  Pulse: 89  Temp: 36.6 C  Resp: 12    Complications: No apparent anesthesia complications

## 2015-02-28 NOTE — H&P (Signed)
Patient ID: Anita Nelson, female DOB: 13-Dec-1970, 44 y.o. MRN: [id number]  Chief Complaint  Patient presents with  . Follow-up    mammogram    HPI Anita Nelson is a 44 y.o. female who presents for a breast evaluation. The most recent mammogram was done on 11/22/14.  Patient does perform regular self breast checks and gets regular mammograms done.  No new breast issues. 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  HPI  Past Medical History  Diagnosis Date  . Migraine   . Anxiety   . Depression   . Septal defect, heart 2016    Anita Nelson septal hypokinesis    Past Surgical History  Procedure Laterality Date  . Tubal ligation  2007  . Breast biopsy Right 03-25-14    Stromal fibrosis and benign calcifications    Family History  Problem Relation Age of Onset  . Cancer Mother 52    breast    Social History History  Substance Use Topics  . Smoking status: Current Every Day Smoker -- 1.00 packs/day for 25 years    Types: Cigarettes  . Smokeless tobacco: Never Used  . Alcohol Use: No    No Known Allergies  Current Outpatient Prescriptions  Medication Sig Dispense Refill  . amitriptyline (ELAVIL) 25 MG tablet Take 25 mg by mouth at bedtime.    . citalopram (CELEXA) 40 MG tablet Take 40 mg by mouth daily.    Marland Kitchen ibuprofen (ADVIL,MOTRIN) 800 MG tablet Take 800 mg by mouth every 8 (eight) hours as needed.    . SUMAtriptan (IMITREX) 100 MG tablet Take by mouth every 2 (two) hours as needed.      No current facility-administered medications for this visit.    Review of Systems Review of Systems  Constitutional: Negative.  Respiratory: Positive for shortness of breath.  Cardiovascular: Negative.    Blood pressure 98/62, pulse 78, resp. rate 14, height 5' 7"  (1.702 m), weight 67.132 kg (148 lb), last menstrual period  11/21/2014.  Physical Exam Physical Exam  Constitutional: She is oriented to person, place, and time. She appears well-developed and well-nourished.  Neck: Neck supple.  Cardiovascular: Normal rate, regular rhythm and normal heart sounds.  Pulmonary/Chest: Breath sounds normal. Right breast exhibits no inverted nipple, no mass, no nipple discharge, no skin change and no tenderness. Left breast exhibits no inverted nipple, no mass, no nipple discharge, no skin change and no tenderness.  Lymphadenopathy:   She has no cervical adenopathy.   She has no axillary adenopathy.  Neurological: She is alert and oriented to person, place, and time.  Skin: Skin is warm and dry.    Data Reviewed PATHOLOGY REPORT  Pathology Report  .                [  Final Report     ]           Material submitted:                    Marland Kitchen  LEFT BREAST  .                [  Final Report     ]           Pre-operative diagnosis:                    .  LEFT BREAST CALCS  .                [  Final Report     ]           Diagnosis:  LEFT BREAST:  - STROMAL FIBROSIS AND BENIGN MAMMARY EPITHELIUM WITH SCATTERED  MICROCALCIFICATIONS.  Marland Kitchen  NOTE: The results of this case were discussed with Dr. Bary Castilla on  March 26, 2014 by Dr. Luana Shu.  XDB/03/26/2014   11/22/2014 left breast mammogram showed persistence of the small area of microcalcifications previously targeted for stereotactic biopsy. BI-RADS-4.   Assessment    The role for needle localization biopsy considering the marked difficulty with the previous stereotactic procedure was reviewed. This will need to be deferred pending her completion of cardiology evaluation.    Plan    The patient will contact the office after she completes her cardiac evaluation and will work on scheduling an outpatient procedure under general or local anesthesia  with sedation as recommended by her cardiologist.     Discussed risk and benefits of left breast biopsy with needle localization.   PCP: Sparks,Jeffrey Dr. Earney Mallet, Anita Nelson 12/11/2014, 6:49 AM

## 2015-02-28 NOTE — Op Note (Signed)
Preoperative diagnosis: Left breast microcalcifications.  Postoperative diagnosis: Same  Operative procedure: Left breast biopsy with needle localization.  Operating surgeon: Georgina Snell, M.D.  Anesthesia: Gen. by LMA, Marcaine 0.5% with 1-200,000 epinephrine, 20 mL.  Estimated blood loss 5 mL.  Clinical note this 44 year old man area of microcalcifications. Attempted stereotactic biopsy last year was unsuccessful. She brought the operative at this time for planned needle localization biopsy. Wire localization was completed with the calcification of the anterior to the wire in the retroareolar area of the left breast.  Operative note: With the patient under adequate general anesthesia the areas prepped with chlor prep and drape. Marcaine was infiltrated for postoperative analgesia. A radial incision was made from the base the nipple to the edge of the areola. The skin and subcutis tissue is divided with cautery. The localizing wire was brought into the field. A 1.5 x 1.5 x 3 cm block of tissue was orientated and specimen radiography obtained. This confirmed the area of calcifications within the specimen. Hemostasis was electrocautery. The wound was closed in layers with 2-050 figure-of-eight sutures. The skin was closed with a running 4-0 Vicryls septic suture. Benzoin Steri-Strips were applied followed by Telfa and Tegaderm.  Patient tolerated procedure well and was taken recovery room in stable condition.Marland Kitchen

## 2015-02-28 NOTE — Discharge Instructions (Addendum)
Place a small ice pack in your bra intermittently today and tomorrow to minimize swelling.        AMBULATORY SURGERY  DISCHARGE INSTRUCTIONS    1) The drugs that you were given will stay in your system until tomorrow so for the next 24 hours you should not:  A) Drive an automobile B) Make any legal decisions C) Drink any alcoholic beverage   2) You may resume regular meals tomorrow.  Today it is better to start with liquids and gradually work up to solid foods.  You may eat anything you prefer, but it is better to start with liquids, then soup and crackers, and gradually work up to solid foods.   3) Please notify your doctor immediately if you have any unusual bleeding, trouble breathing, redness and pain at the surgery site, drainage, fever, or pain not relieved by medication. 4)   5) Your post-operative visit with Dr.                                     is: Date:                        Time:    Please call to schedule your post-operative visit.  6) Additional Instructions:

## 2015-02-28 NOTE — Anesthesia Postprocedure Evaluation (Signed)
  Anesthesia Post-op Note  Patient: Anita Nelson  Procedure(s) Performed: Procedure(s): BREAST BIOPSY WITH NEEDLE LOCALIZATION (Left)  Anesthesia type:General  Patient location: PACU  Post pain: Pain level controlled  Post assessment: Post-op Vital signs reviewed, Patient's Cardiovascular Status Stable, Respiratory Function Stable, Patent Airway and No signs of Nausea or vomiting  Post vital signs: Reviewed and stable  Last Vitals:  Filed Vitals:   02/28/15 1218  BP:   Pulse:   Temp: 36.7 C  Resp:     Level of consciousness: awake, alert  and patient cooperative  Complications: No apparent anesthesia complications

## 2015-02-28 NOTE — Anesthesia Preprocedure Evaluation (Addendum)
Anesthesia Evaluation  Patient identified by MRN, date of birth, ID band Patient awake    Reviewed: Allergy & Precautions, NPO status , Patient's Chart, lab work & pertinent test results  Airway Mallampati: III   Neck ROM: Full  Mouth opening: Limited Mouth Opening  Dental  (+) Teeth Intact   Pulmonary shortness of breath and with exertion, Current Smoker,  breath sounds clear to auscultation  Pulmonary exam normal       Cardiovascular Exercise Tolerance: Good Rhythm:Regular Rate:Normal  Good EF on echo but has septal hypo kinesis. SOBOE.   Neuro/Psych    GI/Hepatic   Endo/Other    Renal/GU      Musculoskeletal   Abdominal Normal abdominal exam  (+)   Peds  Hematology   Anesthesia Other Findings   Reproductive/Obstetrics                            Anesthesia Physical Anesthesia Plan  ASA: III  Anesthesia Plan: General   Post-op Pain Management:    Induction: Intravenous  Airway Management Planned: LMA  Additional Equipment:   Intra-op Plan:   Post-operative Plan: Extubation in OR  Informed Consent: I have reviewed the patients History and Physical, chart, labs and discussed the procedure including the risks, benefits and alternatives for the proposed anesthesia with the patient or authorized representative who has indicated his/her understanding and acceptance.     Plan Discussed with: CRNA  Anesthesia Plan Comments:         Anesthesia Quick Evaluation

## 2015-03-04 ENCOUNTER — Telehealth: Payer: Self-pay | Admitting: General Surgery

## 2015-03-04 ENCOUNTER — Other Ambulatory Visit: Payer: Self-pay | Admitting: General Surgery

## 2015-03-04 LAB — SURGICAL PATHOLOGY

## 2015-03-04 NOTE — Telephone Encounter (Signed)
c 

## 2015-03-10 ENCOUNTER — Encounter: Payer: Self-pay | Admitting: General Surgery

## 2015-03-10 ENCOUNTER — Ambulatory Visit (INDEPENDENT_AMBULATORY_CARE_PROVIDER_SITE_OTHER): Payer: 59 | Admitting: General Surgery

## 2015-03-10 VITALS — BP 122/68 | HR 81 | Resp 12 | Ht 67.0 in | Wt 148.0 lb

## 2015-03-10 DIAGNOSIS — R920 Mammographic microcalcification found on diagnostic imaging of breast: Secondary | ICD-10-CM

## 2015-03-10 NOTE — Progress Notes (Signed)
Patient ID: Anita Nelson, female   DOB: 1970/10/25, 44 y.o.   MRN: [id number]  Chief Complaint  Patient presents with  . Routine Post Op    left breast     HPI Anita Nelson is a 44 y.o. female here today for her post op left breast biopsy done one 02/28/15.  She states she is doing well, still a little sore.  HPI  Past Medical History  Diagnosis Date  . Migraine   . Anxiety   . Depression   . Septal defect, heart 2016    Dr. Saralyn Pilar septal hypokinesis  . GERD (gastroesophageal reflux disease)   . Anemia   . Shortness of breath dyspnea     with activity    Past Surgical History  Procedure Laterality Date  . Tubal ligation  2007  . Breast biopsy Right 03-25-14    Stromal fibrosis and benign calcifications  . Breast biopsy Left 02/28/2015    Procedure: BREAST BIOPSY WITH NEEDLE LOCALIZATION;  Surgeon: Robert Bellow, MD;  Location: [facility] ORS;  Service: General;  Laterality: Left;    Family History  Problem Relation Age of Onset  . Cancer Mother 35    breast    Social History History  Substance Use Topics  . Smoking status: Current Every Day Smoker -- 1.00 packs/day for 25 years    Types: Cigarettes  . Smokeless tobacco: Never Used  . Alcohol Use: No    No Known Allergies  Current Outpatient Prescriptions  Medication Sig Dispense Refill  . amitriptyline (ELAVIL) 25 MG tablet Take 25 mg by mouth at bedtime.    . citalopram (CELEXA) 40 MG tablet Take 40 mg by mouth at bedtime.     Marland Kitchen ibuprofen (ADVIL,MOTRIN) 800 MG tablet Take 800 mg by mouth every 8 (eight) hours as needed.    . SUMAtriptan (IMITREX) 100 MG tablet Take by mouth every 2 (two) hours as needed.      No current facility-administered medications for this visit.    Review of Systems Review of Systems  Constitutional: Negative.   Respiratory: Negative.   Cardiovascular: Negative.     Blood pressure 122/68, pulse 81, resp. rate 12, height 5' 7"  (1.702 m), weight 148 lb (67.132 kg).  Physical  Exam Physical Exam  Constitutional: She is oriented to person, place, and time. She appears well-developed and well-nourished.  Eyes: Conjunctivae are normal. No scleral icterus.  Pulmonary/Chest:    Well healed incision   Lymphadenopathy:    She has no cervical adenopathy.  Neurological: She is alert and oriented to person, place, and time.  Skin: Skin is warm and dry.    Data Reviewed A. LEFT BREAST: MAMMOGRAPHICALLY-DIRECTED EXCISIONAL BIOPSY WITH WIRE  LOCALIZATION:  - COLUMNAR CELL CHANGE AND USUAL DUCTAL HYPERPLASIA ASSOCIATED WITH  CALCIFICATIONS, 3 MM FOCUS.  - SCLEROTIC MICROPAPILLOMA, 1 MM.  - FOCAL FIBROSIS AND HEMOSIDERIN DEPOSITION CONSISTENT WITH PREVIOUS  CORE BIOPSY.   Assessment    Doing well status post needle localization and biopsy of left breast microcalcifications.   Plan        The patient was asked to apply Neosporin to the scar to minimize scab formation. She'll continue to wear her bra day and night until the breast heaviness resolves.  We'll plan for a follow-up examination in one month.   PCP:  Naomie Dean 03/10/2015, 12:21 PM

## 2015-03-10 NOTE — Patient Instructions (Signed)
Apply neosporin to incision site. Call if anything changes. Return in one month.

## 2015-03-28 ENCOUNTER — Emergency Department: Payer: 59

## 2015-03-28 ENCOUNTER — Emergency Department
Admission: EM | Admit: 2015-03-28 | Discharge: 2015-03-28 | Disposition: A | Discharge status: Home / Self Care | Payer: 59 | Attending: Emergency Medicine | Admitting: Emergency Medicine

## 2015-03-28 ENCOUNTER — Encounter: Payer: Self-pay | Admitting: *Deleted

## 2015-03-28 DIAGNOSIS — G43909 Migraine, unspecified, not intractable, without status migrainosus: Secondary | ICD-10-CM | POA: Insufficient documentation

## 2015-03-28 DIAGNOSIS — Z720 Tobacco use: Secondary | ICD-10-CM | POA: Insufficient documentation

## 2015-03-28 DIAGNOSIS — R5383 Other fatigue: Secondary | ICD-10-CM | POA: Diagnosis not present

## 2015-03-28 DIAGNOSIS — R079 Chest pain, unspecified: Secondary | ICD-10-CM | POA: Insufficient documentation

## 2015-03-28 LAB — CBC
HEMATOCRIT: 36.7 % (ref 35.0–47.0)
Hemoglobin: 12.3 g/dL (ref 12.0–16.0)
MCH: 31.5 pg (ref 26.0–34.0)
MCHC: 33.6 g/dL (ref 32.0–36.0)
MCV: 93.7 fL (ref 80.0–100.0)
Platelets: 169 10*3/uL (ref 150–440)
RBC: 3.92 MIL/uL (ref 3.80–5.20)
RDW: 13.6 % (ref 11.5–14.5)
WBC: 7.3 10*3/uL (ref 3.6–11.0)

## 2015-03-28 LAB — BASIC METABOLIC PANEL
Anion gap: 5 (ref 5–15)
BUN: 16 mg/dL (ref 6–20)
CALCIUM: 9.1 mg/dL (ref 8.9–10.3)
CO2: 28 mmol/L (ref 22–32)
Chloride: 107 mmol/L (ref 101–111)
Creatinine, Ser: 0.82 mg/dL (ref 0.44–1.00)
GFR calc Af Amer: >60 mL/min (ref >60)
GLUCOSE: 89 mg/dL (ref 65–99)
Potassium: 3.8 mmol/L (ref 3.5–5.1)
Sodium: 140 mmol/L (ref 135–145)

## 2015-03-28 LAB — TROPONIN I: Troponin I: <0.03 ng/mL (ref <0.031)

## 2015-03-28 LAB — FIBRIN DERIVATIVES D-DIMER (ARMC ONLY): FIBRIN DERIVATIVES D-DIMER (ARMC): 1498 — AB (ref 0–499)

## 2015-03-28 MED ORDER — SODIUM CHLORIDE 0.9 % IV BOLUS (SEPSIS)
1000.0000 mL | Freq: Once | INTRAVENOUS | Status: AC
  Administered 2015-03-28: 1000 mL via INTRAVENOUS

## 2015-03-28 MED ORDER — IOHEXOL 350 MG/ML SOLN
100.0000 mL | Freq: Once | INTRAVENOUS | Status: AC | PRN
  Administered 2015-03-28: 100 mL via INTRAVENOUS

## 2015-03-28 NOTE — ED Notes (Signed)
Patient transported to CT 

## 2015-03-28 NOTE — ED Notes (Signed)
Pt returned from CT await results and dispo,  Family at bedside.

## 2015-03-28 NOTE — ED Provider Notes (Signed)
[facility] Emergency Department Provider Note  ____________________________________________  Time seen: 12:50 PM  I have reviewed the triage vital signs and the nursing notes.   HISTORY  Chief Complaint Chest Pain  fatigue    HPI Anita Nelson is a 44 y.o. female who reports she began to have chest pain at 10 AM this morning. She had gone to work, got in the elevator, and had the sudden onset of pain in her mid chest. The severity of the pain has eased, but she still has some discomfort. This feels worse when she aches deep breath. It is not worse with movement.   She has felt weak and fatigued for a number of weeks. Yesterday, at a medical screening day at work, her blood pressure was noted to be low with a systolic pressure of 82. Due to this low blood pressure reading, she has arranged to see her cardiologist on Monday. She also has an appointment to see her primary doctor, Dr. Doy Hutching, next Friday.  The patient has a history of septal hypokinesis, diagnosed from an echocardiogram. It is unclear with the indication for the echocardiogram was.  She had a biopsy of her left breast 3 weeks ago. She tells me the biopsy result was benign. She continues to have some tenderness in the left breast.   Past Medical History  Diagnosis Date  . Migraine   . Anxiety   . Depression   . Septal defect, heart 2016    Dr. Saralyn Pilar septal hypokinesis  . GERD (gastroesophageal reflux disease)   . Anemia   . Shortness of breath dyspnea     with activity    Patient Active Problem List   Diagnosis Date Noted  . Breast microcalcification, mammographic 03/18/2014    Past Surgical History  Procedure Laterality Date  . Tubal ligation  2007  . Breast biopsy Right 03-25-14    Stromal fibrosis and benign calcifications  . Breast biopsy Left 02/28/2015    Procedure: BREAST BIOPSY WITH NEEDLE LOCALIZATION;  Surgeon: Robert Bellow, MD;  Location: [facility] ORS;  Service:  General;  Laterality: Left;    Current Outpatient Rx  Name  Route  Sig  Dispense  Refill  . citalopram (CELEXA) 40 MG tablet   Oral   Take 40 mg by mouth at bedtime.          Marland Kitchen ibuprofen (ADVIL,MOTRIN) 800 MG tablet   Oral   Take 800 mg by mouth every 8 (eight) hours as needed for headache or mild pain.          . SUMAtriptan (IMITREX) 100 MG tablet   Oral   Take 100 mg by mouth every 2 (two) hours as needed for migraine. May repeat in 2 hours if headache persists or recurs.           Allergies Review of patient's allergies indicates no known allergies.  Family History  Problem Relation Age of Onset  . Cancer Mother 72    breast    Social History Social History  Substance Use Topics  . Smoking status: Current Every Day Smoker -- 1.00 packs/day for 25 years    Types: Cigarettes  . Smokeless tobacco: Never Used  . Alcohol Use: No    Review of Systems  Constitutional: Negative for fever. Positive for general fatigue. ENT: Negative for sore throat. Cardiovascular: Positive for chest pain. See history of present illness Respiratory: Negative for shortness of breath. Gastrointestinal: Negative for abdominal pain, vomiting and diarrhea. Genitourinary:  Negative for dysuria. Musculoskeletal: No myalgias or injuries. Skin: Negative for rash. Neurological: History of migraines. Patient had some migraines last week.   10-point ROS otherwise negative.  ____________________________________________   PHYSICAL EXAM:  VITAL SIGNS: ED Triage Vitals  Enc Vitals Group     BP 03/28/15 1156 101/62 mmHg     Pulse Rate 03/28/15 1156 80     Resp 03/28/15 1156 15     Temp 03/28/15 1156 97.7 F (36.5 C)     Temp Source 03/28/15 1156 Oral     SpO2 03/28/15 1153 98 %     Weight 03/28/15 1156 150 lb (68.04 kg)     Height 03/28/15 1156 5' 7"  (1.702 m)     Head Cir --      Peak Flow --      Pain Score 03/28/15 1157 4     Pain Loc --      Pain Edu? --      Excl. in War?  --     Constitutional:  Alert and oriented. Appears well-nourished and well-developed. No acute distress, although she does appear somewhat fatigued.Marland Kitchen ENT   Head: Normocephalic and atraumatic.   Nose: No congestion/rhinnorhea.   Mouth/Throat: Mucous membranes are moist. Cardiovascular: Normal rate of 82, regular rhythm, no murmur noted Chest wall: No tenderness noted. Respiratory:  Normal respiratory effort, no tachypnea.    Breath sounds are clear and equal bilaterally.  Gastrointestinal: Soft and nontender. No distention.  Back: No muscle spasm, no tenderness, no CVA tenderness. Musculoskeletal: No deformity noted. Nontender with normal range of motion in all extremities.  No noted edema. Neurologic:  Normal speech and language. No gross focal neurologic deficits are appreciated.  Skin:  Skin is warm, dry. No rash noted. Psychiatric: Mood and affect are normal. Speech and behavior are normal.  ____________________________________________    LABS (pertinent positives/negatives)  Labs Reviewed  FIBRIN DERIVATIVES D-DIMER ([facility] ONLY) - Abnormal; Notable for the following:    Fibrin derivatives D-dimer [facility]) 1498 (*)    All other components within normal limits  BASIC METABOLIC PANEL  CBC  TROPONIN I     ____________________________________________   EKG  ED ECG REPORT I, Ensley Blas W, the attending physician, personally viewed and interpreted this ECG.   Date: 03/28/2015  EKG Time: 12:16 PM  Rate: 76  Rhythm: Normal sinus rhythm  Axis: Normal  Intervals: Normal  ST&T Change: None noted   ____________________________________________    RADIOLOGY  Chest x-ray:  IMPRESSION: No active cardiopulmonary disease.  CT scan chest to evaluate for PE: IMPRESSION: 1. No pulmonary embolus is noted. 2. No acute infiltrate or pulmonary edema. 3. No mediastinal hematoma or adenopathy. 4. Hyperdense lesion within spleen measures 8 mm. Probable  splenic cysts are noted the largest measures 1.1 cm. Follow-up CT scan in 3 months is recommended to assure stability  ____________________________________________  ____________________________________________   INITIAL IMPRESSION / ASSESSMENT AND PLAN / ED COURSE  Pertinent labs & imaging results that were available during my care of the patient were reviewed by me and considered in my medical decision making (see chart for details).  Overall well-appearing 34 0 female except for the fatigue noted and the chest pain she is expressing. Her initial troponin is negative area of her hemoglobin and other blood tests looked normal. Her EKG is normal. Her chest x-ray is unremarkable. I have added on a d-dimer. I have low suspicion for pulmonary emboli, however, she does have some discomfort when she takes a deeper breath  and she did have a minor surgical procedure approximately 3-3-1/2 weeks ago.  ----------------------------------------- 5:02 PM on 03/28/2015 -----------------------------------------  Patient had a positive d-dimer. I discussed the need for a CT scan of her chest based on the concerns for possible pulmonary emboli given her pleuritic symptoms. The patient agreed with the CT scan. The CT scan does not show any pulmonary emboli.  I densely lesion within the spleen was noted with the recommendation of follow-up CT scan in 3 months to assess for stability. I have passed this information along to the patient.  The patient appears to have improved significantly during her time in the emergency department and is in no acute distress. I've advised her to follow-up with Dr. Doy Hutching, her primary physician.   ____________________________________________   FINAL CLINICAL IMPRESSION(S) / ED DIAGNOSES  Final diagnoses:  Chest pain, unspecified chest pain type      Ahmed Prima, MD 03/28/15 1706

## 2015-03-28 NOTE — ED Notes (Signed)
Pt came from work, c/o chest pain, Dr Rockney Ghee Dx pt with cardio hypokiensis earlier this year, VS WNL at this time EKG unremarkable via EMS

## 2015-03-28 NOTE — Discharge Instructions (Signed)
It is unclear what is causing her pain. We have discussed cardiac, pulmonary, vascular, esophageal, and musculoskeletal problems. He did have a blood test that could not rule out a blood clot which led to the CT scan for your chest. You do not have a blood clot seen on CT. You do have a hyperdense spot in your spleen. A cyst can look like this, but the radiologist is recommending a repeat CT in 3 months to be sure that it is stable. Speak with Dr. Doy Hutching about this and follow-up with him for the discomfort you have been having. Return to the emergency department if you have worsening chest pain, shortness of breath, or other urgent concerns.  Chest Pain (Nonspecific) It is often hard to give a specific diagnosis for the cause of chest pain. There is always a chance that your pain could be related to something serious, such as a heart attack or a blood clot in the lungs. You need to follow up with your health care provider for further evaluation. CAUSES   Heartburn.  Pneumonia or bronchitis.  Anxiety or stress.  Inflammation around your heart (pericarditis) or lung (pleuritis or pleurisy).  A blood clot in the lung.  A collapsed lung (pneumothorax). It can develop suddenly on its own (spontaneous pneumothorax) or from trauma to the chest.  Shingles infection (herpes zoster virus). The chest wall is composed of bones, muscles, and cartilage. Any of these can be the source of the pain.  The bones can be bruised by injury.  The muscles or cartilage can be strained by coughing or overwork.  The cartilage can be affected by inflammation and become sore (costochondritis). DIAGNOSIS  Lab tests or other studies may be needed to find the cause of your pain. Your health care provider may have you take a test called an ambulatory electrocardiogram (ECG). An ECG records your heartbeat patterns over a 24-hour period. You may also have other tests, such as:  Transthoracic echocardiogram (TTE). During  echocardiography, sound waves are used to evaluate how blood flows through your heart.  Transesophageal echocardiogram (TEE).  Cardiac monitoring. This allows your health care provider to monitor your heart rate and rhythm in real time.  Holter monitor. This is a portable device that records your heartbeat and can help diagnose heart arrhythmias. It allows your health care provider to track your heart activity for several days, if needed.  Stress tests by exercise or by giving medicine that makes the heart beat faster. TREATMENT   Treatment depends on what may be causing your chest pain. Treatment may include:  Acid blockers for heartburn.  Anti-inflammatory medicine.  Pain medicine for inflammatory conditions.  Antibiotics if an infection is present.  You may be advised to change lifestyle habits. This includes stopping smoking and avoiding alcohol, caffeine, and chocolate.  You may be advised to keep your head raised (elevated) when sleeping. This reduces the chance of acid going backward from your stomach into your esophagus. Most of the time, nonspecific chest pain will improve within 2-3 days with rest and mild pain medicine.  HOME CARE INSTRUCTIONS   If antibiotics were prescribed, take them as directed. Finish them even if you start to feel better.  For the next few days, avoid physical activities that bring on chest pain. Continue physical activities as directed.  Do not use any tobacco products, including cigarettes, chewing tobacco, or electronic cigarettes.  Avoid drinking alcohol.  Only take medicine as directed by your health care provider.  Follow  your health care provider's suggestions for further testing if your chest pain does not go away.  Keep any follow-up appointments you made. If you do not go to an appointment, you could develop lasting (chronic) problems with pain. If there is any problem keeping an appointment, call to reschedule. SEEK MEDICAL CARE IF:    Your chest pain does not go away, even after treatment.  You have a rash with blisters on your chest.  You have a fever. SEEK IMMEDIATE MEDICAL CARE IF:   You have increased chest pain or pain that spreads to your arm, neck, jaw, back, or abdomen.  You have shortness of breath.  You have an increasing cough, or you cough up blood.  You have severe back or abdominal pain.  You feel nauseous or vomit.  You have severe weakness.  You faint.  You have chills. This is an emergency. Do not wait to see if the pain will go away. Get medical help at once. Call your local emergency services (911 in [place] Do not drive yourself to the hospital. MAKE SURE YOU:   Understand these instructions.  Will watch your condition.  Will get help right away if you are not doing well or get worse. Document Released: 05/05/2005 Document Revised: 07/31/2013 Document Reviewed: 02/29/2008 [facility] Patient Information 2015 [place], [place]. This information is not intended to replace advice given to you by your health care provider. Make sure you discuss any questions you have with your health care provider.

## 2015-04-07 ENCOUNTER — Ambulatory Visit: Payer: 59 | Admitting: General Surgery

## 2015-04-08 ENCOUNTER — Telehealth: Payer: Self-pay | Admitting: *Deleted

## 2015-04-08 NOTE — Telephone Encounter (Signed)
I talked with the patient and she did not have a MI. Her blood pressure has been low so she will need to wear a blood pressure monitor starting Friday, she is followed by Dr Saralyn Pilar. No issues post breast surgery. Discussed follow-up appointments for November, patient agrees. Placed in recalls.

## 2015-04-08 NOTE — Telephone Encounter (Signed)
-----   Message from Robert Bellow, MD sent at 04/07/2015 12:22 PM EDT ----- Patient was seen in the emergency department on August 19 the chest pain. She missed her appointment on August 29. Please check in on her see if she is doing well. She does need to have bilateral diagnostic mammograms at the end of the year. If she's doing well, we can defer an appointment until that time.

## 2015-05-01 ENCOUNTER — Other Ambulatory Visit: Payer: Self-pay

## 2015-05-01 DIAGNOSIS — R920 Mammographic microcalcification found on diagnostic imaging of breast: Secondary | ICD-10-CM

## 2015-06-10 ENCOUNTER — Other Ambulatory Visit: Payer: 59

## 2015-06-10 ENCOUNTER — Ambulatory Visit: Payer: 59 | Attending: General Surgery

## 2015-06-19 ENCOUNTER — Ambulatory Visit: Payer: 59 | Admitting: General Surgery

## 2015-08-14 ENCOUNTER — Encounter: Payer: Self-pay | Admitting: *Deleted

## 2016-05-15 ENCOUNTER — Emergency Department: Payer: 59

## 2016-05-15 ENCOUNTER — Emergency Department
Admission: EM | Admit: 2016-05-15 | Discharge: 2016-05-15 | Disposition: A | Discharge status: Home / Self Care | Payer: 59 | Attending: Student in an Organized Health Care Education/Training Program | Admitting: Student in an Organized Health Care Education/Training Program

## 2016-05-15 ENCOUNTER — Encounter: Payer: Self-pay | Admitting: Emergency Medicine

## 2016-05-15 DIAGNOSIS — F17210 Nicotine dependence, cigarettes, uncomplicated: Secondary | ICD-10-CM | POA: Insufficient documentation

## 2016-05-15 DIAGNOSIS — F17200 Nicotine dependence, unspecified, uncomplicated: Secondary | ICD-10-CM

## 2016-05-15 DIAGNOSIS — J209 Acute bronchitis, unspecified: Secondary | ICD-10-CM | POA: Insufficient documentation

## 2016-05-15 MED ORDER — SULFAMETHOXAZOLE-TRIMETHOPRIM 800-160 MG PO TABS: 1.0000 | ORAL_TABLET | Freq: Two times a day (BID) | ORAL | 0 refills | Status: DC

## 2016-05-15 MED ORDER — IPRATROPIUM-ALBUTEROL 0.5-2.5 (3) MG/3ML IN SOLN
3.0000 mL | Freq: Once | RESPIRATORY_TRACT | Status: AC
Start: 2016-05-15 — End: 2016-05-15
  Administered 2016-05-15: 3 mL via RESPIRATORY_TRACT
  Filled 2016-05-15: qty 3

## 2016-05-15 MED ORDER — PREDNISONE 10 MG PO TABS: ORAL_TABLET | ORAL | 0 refills | Status: DC

## 2016-05-15 MED ORDER — BENZONATATE 100 MG PO CAPS: ORAL_CAPSULE | ORAL | 0 refills | Status: DC

## 2016-05-15 NOTE — ED Triage Notes (Signed)
Cough x 1 week. Productive at times. Spasmodic coughing. Smokes 1ppd

## 2016-05-15 NOTE — ED Notes (Signed)
Pt transported to xray 

## 2016-05-15 NOTE — Discharge Instructions (Signed)
Follow-up with Dr. Doy Hutching if any continued problems. Discontinue smoking. Prednisone 3 tablets 1 time a day for 3 days. Tessalon Perles one or 2 every 8 hours as needed for cough. Bactrim DS 1 twice a day for 10 days.

## 2016-05-15 NOTE — ED Provider Notes (Signed)
[facility] Emergency Department Provider Note   ____________________________________________   First MD Initiated Contact with Patient 05/15/16 1544     (approximate)  I have reviewed the triage vital signs and the nursing notes.   HISTORY  Chief Complaint Cough    HPI Anita Nelson is a 45 y.o. female is here with complaint of nonproductive cough for the last week. Patient states that at times she is able to cough up something clear but for the most part is nonproductive. She denies any nausea, vomiting, fever or chills. She states that she feels short of breath when she "has a coughing spell". She states that because of the coughing she has been unable to sleep. Patient is been taking TheraFlu without any relief. Patient continues to smoke one pack of cigarettes per day. She denies any chest pain other than when she is coughing. Currently she rates her pain as a 2 out of 10. She denies any previous bronchitis or pneumonia.   Past Medical History:  Diagnosis Date  . Anemia   . Anxiety   . Depression   . GERD (gastroesophageal reflux disease)   . Migraine   . Septal defect, heart 2016   Dr. Saralyn Pilar septal hypokinesis  . Shortness of breath dyspnea    with activity    Patient Active Problem List   Diagnosis Date Noted  . Breast microcalcification, mammographic 03/18/2014    Past Surgical History:  Procedure Laterality Date  . BREAST BIOPSY Right 03-25-14   Stromal fibrosis and benign calcifications  . BREAST BIOPSY Left 02/28/2015   Procedure: BREAST BIOPSY WITH NEEDLE LOCALIZATION;  Surgeon: Robert Bellow, MD;  Location: [facility] ORS;  Service: General;  Laterality: Left;  . TUBAL LIGATION  2007    Prior to Admission medications   Medication Sig Start Date End Date Taking? Authorizing Provider  benzonatate (TESSALON PERLES) 100 MG capsule Take one or 2 tablets every 8 hours as needed for cough. 05/15/16   Johnn Hai, PA-C    citalopram (CELEXA) 40 MG tablet Take 40 mg by mouth at bedtime.     Historical Provider, MD  ibuprofen (ADVIL,MOTRIN) 800 MG tablet Take 800 mg by mouth every 8 (eight) hours as needed for headache or mild pain.     Historical Provider, MD  predniSONE (DELTASONE) 10 MG tablet Take 3 tablets 1 time a day for 3 days. 05/15/16   Johnn Hai, PA-C  sulfamethoxazole-trimethoprim (BACTRIM DS,SEPTRA DS) 800-160 MG tablet Take 1 tablet by mouth 2 (two) times daily. 05/15/16   Johnn Hai, PA-C  SUMAtriptan (IMITREX) 100 MG tablet Take 100 mg by mouth every 2 (two) hours as needed for migraine. May repeat in 2 hours if headache persists or recurs.    Historical Provider, MD    Allergies Review of patient's allergies indicates no known allergies.  Family History  Problem Relation Age of Onset  . Cancer Mother 35    breast    Social History Social History  Substance Use Topics  . Smoking status: Current Every Day Smoker    Packs/day: 1.00    Years: 25.00    Types: Cigarettes  . Smokeless tobacco: Never Used  . Alcohol use No    Review of Systems Constitutional: No fever/chills Eyes: No visual changes. ENT: No sore throat.Denies pain. Cardiovascular: Denies chest pain. Respiratory: Positive shortness of breath with coughing. Gastrointestinal: No abdominal pain.  No nausea, no vomiting.  No diarrhea.  Skin: Negative for rash.  Neurological: Negative for headaches, focal weakness or numbness.  10-point ROS otherwise negative.  ____________________________________________   PHYSICAL EXAM:  VITAL SIGNS: ED Triage Vitals  Enc Vitals Group     BP 05/15/16 1417 113/70     Pulse Rate 05/15/16 1417 98     Resp 05/15/16 1417 16     Temp 05/15/16 1417 98 F (36.7 C)     Temp Source 05/15/16 1417 Oral     SpO2 05/15/16 1417 98 %     Weight 05/15/16 1412 155 lb (70.3 kg)     Height 05/15/16 1412 5' 7"  (1.702 m)     Head Circumference --      Peak Flow --      Pain Score  05/15/16 1412 2     Pain Loc --      Pain Edu? --      Excl. in [place]? --     Constitutional: Alert and oriented. Well appearing and in no acute distress. Eyes: Conjunctivae are normal. PERRL. EOMI. Head: Atraumatic. Nose: Mild congestion/no rhinnorhea.  EACs are clear bilaterally. TMs are dull. Mouth/Throat: Mucous membranes are moist.  Oropharynx non-erythematous. Neck: No stridor.   Hematological/Lymphatic/Immunilogical: No cervical lymphadenopathy. Cardiovascular: Normal rate, regular rhythm. Grossly normal heart sounds.  Good peripheral circulation. Respiratory: Normal respiratory effort.  No retractions. Lungs CTAB.  Gastrointestinal: Soft and nontender. No distention.  Musculoskeletal: No lower extremity tenderness nor edema.  No joint effusions. Neurologic:  Normal speech and language. No gross focal neurologic deficits are appreciated. No gait instability. Skin:  Skin is warm, dry and intact. No rash noted. Psychiatric: Mood and affect are normal. Speech and behavior are normal.  ____________________________________________   LABS (all labs ordered are listed, but only abnormal results are displayed)  Labs Reviewed - No data to display ____________________________________________  EKG  NSR Vent rate 93 PR interval 128 QRS duration 78  ____________________________________________  RADIOLOGY Chest x-ray per radiologist is negative for cardiopulmonary disease. I, Johnn Hai, personally viewed and evaluated these images (plain radiographs) as part of my medical decision making, as well as reviewing the written report by the radiologist.  ____________________________________________   PROCEDURES  Procedure(s) performed: None  Procedures  Critical Care performed: No  ____________________________________________   INITIAL IMPRESSION / ASSESSMENT AND PLAN / ED COURSE  Pertinent labs & imaging results that were available during my care of the patient were  reviewed by me and considered in my medical decision making (see chart for details).    Clinical Course  Patient did see improvement after having a DuoNeb treatment. Patient states that she does not have insurance currently and cannot afford medication that is not on the $4 list. Patient is encouraged to discontinue smoking. Patient was given a prescription for prednisone 30 mg once a day for 3 days. Tessalon Perles 1 or 2 every 8 hours as needed for cough. Septra DS twice a day for 10 days. Patient is follow-up with Dr. Doy Hutching if any continued problems. She also may Take Tylenol if needed for fever or headache.   ____________________________________________   FINAL CLINICAL IMPRESSION(S) / ED DIAGNOSES  Final diagnoses:  Acute bronchitis, unspecified organism  Current every day smoker      NEW MEDICATIONS STARTED DURING THIS VISIT:  Discharge Medication List as of 05/15/2016  4:32 PM    START taking these medications   Details  benzonatate (TESSALON PERLES) 100 MG capsule Take one or 2 tablets every 8 hours as needed for cough., Print  predniSONE (DELTASONE) 10 MG tablet Take 3 tablets 1 time a day for 3 days., Print    sulfamethoxazole-trimethoprim (BACTRIM DS,SEPTRA DS) 800-160 MG tablet Take 1 tablet by mouth 2 (two) times daily., Starting Sat 05/15/2016, Print         Note:  This document was prepared using Dragon voice recognition software and may include unintentional dictation errors.    Johnn Hai, PA-C 05/15/16 [place], MD 05/19/16 [phone]

## 2016-05-15 NOTE — ED Notes (Addendum)
Pt stating that she has had a cough for the last week. Pt is coughing up some clear at times. Pt denying and n/v or fevers. Pt stating that she feels SOB when she has coughing spells. Pt stating she has been unable to sleep.

## 2016-10-27 ENCOUNTER — Encounter: Payer: Self-pay | Admitting: Emergency Medicine

## 2016-10-27 ENCOUNTER — Emergency Department
Admission: EM | Admit: 2016-10-27 | Discharge: 2016-10-27 | Disposition: A | Discharge status: Home / Self Care | Payer: BLUE CROSS/BLUE SHIELD | Attending: Emergency Medicine | Admitting: Emergency Medicine

## 2016-10-27 DIAGNOSIS — F17210 Nicotine dependence, cigarettes, uncomplicated: Secondary | ICD-10-CM | POA: Diagnosis not present

## 2016-10-27 DIAGNOSIS — Z5321 Procedure and treatment not carried out due to patient leaving prior to being seen by health care provider: Secondary | ICD-10-CM | POA: Insufficient documentation

## 2016-10-27 DIAGNOSIS — K0889 Other specified disorders of teeth and supporting structures: Secondary | ICD-10-CM | POA: Diagnosis present

## 2016-10-27 NOTE — ED Triage Notes (Signed)
Pt ambulatory to triage with steady gait, no distress noted. Pt c/o left bottom dental pain, tooth cracked 2 weeks ago. Pt reports she used floss on the cracked tooth today and has had increasing dental pain since. No swelling or discharge noted on assessment.

## 2016-10-27 NOTE — ED Provider Notes (Signed)
Patient left without being seen by provider. Patient was roomed, and was in room less than 30 minutes before leaving without informing staff or provider.   Charline Bills C[place], PA-C 10/27/16 [place], MD 10/27/16 2330

## 2017-04-12 ENCOUNTER — Emergency Department
Admission: EM | Admit: 2017-04-12 | Discharge: 2017-04-12 | Disposition: A | Discharge status: Home / Self Care | Payer: BLUE CROSS/BLUE SHIELD | Attending: Emergency Medicine | Admitting: Emergency Medicine

## 2017-04-12 ENCOUNTER — Emergency Department: Payer: BLUE CROSS/BLUE SHIELD

## 2017-04-12 DIAGNOSIS — F17210 Nicotine dependence, cigarettes, uncomplicated: Secondary | ICD-10-CM | POA: Diagnosis not present

## 2017-04-12 DIAGNOSIS — Z79899 Other long term (current) drug therapy: Secondary | ICD-10-CM | POA: Diagnosis not present

## 2017-04-12 DIAGNOSIS — R5383 Other fatigue: Secondary | ICD-10-CM | POA: Diagnosis not present

## 2017-04-12 DIAGNOSIS — R531 Weakness: Secondary | ICD-10-CM | POA: Diagnosis not present

## 2017-04-12 DIAGNOSIS — M79602 Pain in left arm: Secondary | ICD-10-CM | POA: Insufficient documentation

## 2017-04-12 LAB — COMPREHENSIVE METABOLIC PANEL
ALT: 14 U/L (ref 14–54)
AST: 20 U/L (ref 15–41)
Albumin: 4.2 g/dL (ref 3.5–5.0)
Alkaline Phosphatase: 80 U/L (ref 38–126)
Anion gap: 8 (ref 5–15)
BILIRUBIN TOTAL: 0.5 mg/dL (ref 0.3–1.2)
BUN: 13 mg/dL (ref 6–20)
CALCIUM: 9.3 mg/dL (ref 8.9–10.3)
CO2: 26 mmol/L (ref 22–32)
CREATININE: 0.74 mg/dL (ref 0.44–1.00)
Chloride: 104 mmol/L (ref 101–111)
Glucose, Bld: 86 mg/dL (ref 65–99)
Potassium: 3.7 mmol/L (ref 3.5–5.1)
Sodium: 138 mmol/L (ref 135–145)
TOTAL PROTEIN: 7.9 g/dL (ref 6.5–8.1)

## 2017-04-12 LAB — CBC
HCT: 38.1 % (ref 35.0–47.0)
HEMOGLOBIN: 13 g/dL (ref 12.0–16.0)
MCH: 31.1 pg (ref 26.0–34.0)
MCHC: 34.1 g/dL (ref 32.0–36.0)
MCV: 91.3 fL (ref 80.0–100.0)
Platelets: 185 10*3/uL (ref 150–440)
RBC: 4.17 MIL/uL (ref 3.80–5.20)
RDW: 13.3 % (ref 11.5–14.5)
WBC: 9.7 10*3/uL (ref 3.6–11.0)

## 2017-04-12 LAB — TROPONIN I: Troponin I: <0.03 ng/mL (ref <0.03)

## 2017-04-12 MED ORDER — ACETAMINOPHEN-CODEINE #3 300-30 MG PO TABS: 1.0000 | ORAL_TABLET | Freq: Four times a day (QID) | ORAL | 0 refills | Status: DC | PRN

## 2017-04-12 MED ORDER — PREDNISONE 10 MG PO TABS
ORAL_TABLET | ORAL | 0 refills | Status: DC
Start: 2017-04-12 — End: 2018-04-06

## 2017-04-12 NOTE — Discharge Instructions (Signed)
Follow-up with Dr. Doy Hutching if any continued problems. Begin taking medication only as directed. Tylenol No. 3 one every 8 hours as needed for left arm pain. You will need to eat prior to taking this medication.

## 2017-04-12 NOTE — ED Triage Notes (Signed)
Pt c/o left arm pain with burning/icy" sensation since last night.

## 2017-04-12 NOTE — ED Notes (Signed)
AAOx3.  Skin warm and dry.  NAD 

## 2017-04-12 NOTE — ED Provider Notes (Signed)
[facility] Emergency Department Provider Note  __________________________________________   First MD Initiated Contact with Patient 04/12/17 1149     (approximate)  I have reviewed the triage vital signs and the nursing notes.   HISTORY  Chief Complaint Arm Pain   HPI Anita Nelson is a 46 y.o. female is complaining of left arm feeling "burning and a icy" since last night. Patient also complains of fatigue and feeling weak. She denies any injury to her arm. She states that there is no increased pain with range of motion of her arm. She denies any nausea, vomiting, chest pain, shortness of breath or history of MIs. Patient states that she has seen a cardiologist in the past because of her blood pressure issues. She denies any family history of cardiac problems. She rates her pain as a 5/10.   Past Medical History:  Diagnosis Date  . Anemia   . Anxiety   . Depression   . GERD (gastroesophageal reflux disease)   . Migraine   . Septal defect, heart 2016   Dr. Saralyn Pilar septal hypokinesis  . Shortness of breath dyspnea    with activity    Patient Active Problem List   Diagnosis Date Noted  . Breast microcalcification, mammographic 03/18/2014    Past Surgical History:  Procedure Laterality Date  . BREAST BIOPSY Right 03-25-14   Stromal fibrosis and benign calcifications  . BREAST BIOPSY Left 02/28/2015   Procedure: BREAST BIOPSY WITH NEEDLE LOCALIZATION;  Surgeon: Robert Bellow, MD;  Location: [facility] ORS;  Service: General;  Laterality: Left;  . TUBAL LIGATION  2007    Prior to Admission medications   Medication Sig Start Date End Date Taking? Authorizing Provider  acetaminophen-codeine (TYLENOL #3) 300-30 MG tablet Take 1 tablet by mouth every 6 (six) hours as needed for moderate pain. 04/12/17   Johnn Hai, PA-C  citalopram (CELEXA) 40 MG tablet Take 40 mg by mouth at bedtime.     [provider]  ibuprofen (ADVIL,MOTRIN) 800  MG tablet Take 800 mg by mouth every 8 (eight) hours as needed for headache or mild pain.     [provider]  predniSONE (DELTASONE) 10 MG tablet Take 3 tablets once a day for 4 days 04/12/17   Johnn Hai, PA-C  SUMAtriptan (IMITREX) 100 MG tablet Take 100 mg by mouth every 2 (two) hours as needed for migraine. May repeat in 2 hours if headache persists or recurs.    [provider]    Allergies Patient has no known allergies.  Family History  Problem Relation Age of Onset  . Cancer Mother 69       breast    Social History Social History  Substance Use Topics  . Smoking status: Current Every Day Smoker    Packs/day: 1.00    Years: 25.00    Types: Cigarettes  . Smokeless tobacco: Never Used  . Alcohol use No    Review of Systems Constitutional: No fever/chills Cardiovascular: Denies chest pain. Respiratory: Denies shortness of breath. Gastrointestinal:   No nausea, no vomiting.  Musculoskeletal: Negative for back pain. Negative for left arm pain. Skin: Negative for rash. Neurological: Positive for left arm altered sensation.  ____________________________________________   PHYSICAL EXAM:  VITAL SIGNS: ED Triage Vitals  Enc Vitals Group     BP 04/12/17 1113 131/75     Pulse Rate 04/12/17 1113 90     Resp 04/12/17 1113 20     Temp 04/12/17 1113  98.5 F (36.9 C)     Temp Source 04/12/17 1113 Oral     SpO2 04/12/17 1113 100 %     Weight 04/12/17 1113 165 lb (74.8 kg)     Height 04/12/17 1113 5' 7"  (1.702 m)     Head Circumference --      Peak Flow --      Pain Score 04/12/17 1123 5     Pain Loc --      Pain Edu? --      Excl. in [place]? --    Constitutional: Alert and oriented. Well appearing and in no acute distress. Eyes: Conjunctivae are normal. PERRL. EOMI. Head: Atraumatic. Nose: No congestion/rhinnorhea. Neck: No stridor.  No point tenderness on palpation of cervical spine.  Range of motion is without restriction or  pain. Cardiovascular: Normal rate, regular rhythm. Grossly normal heart sounds.  Good peripheral circulation. Respiratory: Normal respiratory effort.  No retractions. Lungs CTAB. Gastrointestinal: Soft and nontender. No distention.  Musculoskeletal: Examination of left arm there is no gross deformity and no soft tissue edema present. Range of motion is without pain there is some minimal crepitus noted. Joints move without any difficulty. Patient is able to flex and extend without any difficulty. Grip strength in the left arm is slightly less than the right. Motor sensory function still intact. Capillary refill is less than 3 seconds. Skin is warm and dry. Good peripheral circulation is noted in the left extremity. Neurologic:  Normal speech and language. No gross focal neurologic deficits are appreciated.  Skin:  Skin is warm, dry and intact. No rash noted. No ecchymosis, abrasions or erythema noted. Psychiatric: Mood and affect are normal. Speech and behavior are normal.  ____________________________________________   LABS (all labs ordered are listed, but only abnormal results are displayed)  Labs Reviewed  CBC  TROPONIN I  COMPREHENSIVE METABOLIC PANEL   ____________________________________________  EKG  Per Dr. Cherylann Banas Normal sinus rhythm with a ventricular rate of 93. ____________________________________________  RADIOLOGY  Dg Chest 2 View  Result Date: 04/12/2017 CLINICAL DATA:  Left arm pain, weakness EXAM: CHEST  2 VIEW COMPARISON:  05/15/2016 FINDINGS: Heart and mediastinal contours are within normal limits. No focal opacities or effusions. No acute bony abnormality. IMPRESSION: No active cardiopulmonary disease. Electronically Signed   By: Rolm Baptise M.D.   On: 04/12/2017 12:21    ____________________________________________   PROCEDURES  Procedure(s) performed: None  Procedures  Critical Care performed:  No  ____________________________________________   INITIAL IMPRESSION / ASSESSMENT AND PLAN / ED COURSE  Pertinent labs & imaging results that were available during my care of the patient were reviewed by me and considered in my medical decision making (see chart for details).  On reevaluation patient continued to deny any chest pain, shortness of breath, nausea or vomiting. She is reassured that her troponin was negative. EKG and chest x-ray were reassuring. Patient was given prescription for Tylenol No. 3 one every 8 hours as needed for left arm pain. She is calm make an appointment with Dr. Doy Hutching for further testing and evaluation.    FINAL CLINICAL IMPRESSION(S) / ED DIAGNOSES  Final diagnoses:    Left arm pain Fatigue, unspecified type.    NEW MEDICATIONS STARTED DURING THIS VISIT:     New Prescriptions   No medications on file     Note:  This document was prepared using Dragon voice recognition software and may include unintentional dictation errors.       Johnn Hai, PA-C 04/12/17  [place], [place], MD 04/13/17 [phone]

## 2017-04-12 NOTE — ED Notes (Signed)
See triage note. States she is having left arm pain  Describes discomfort to arm as "coldness" to arm  Denies any other sx's  Denies h/a ,n/v/d or fever  Skin is war to touch  Color good good pulses

## 2017-08-23 ENCOUNTER — Encounter: Payer: Self-pay | Admitting: Obstetrics & Gynecology

## 2017-08-24 ENCOUNTER — Encounter: Payer: 59 | Admitting: Obstetrics & Gynecology

## 2017-09-21 ENCOUNTER — Encounter: Payer: Self-pay | Admitting: Obstetrics & Gynecology

## 2017-09-29 ENCOUNTER — Other Ambulatory Visit: Payer: Self-pay | Admitting: Internal Medicine

## 2017-09-29 DIAGNOSIS — Z79899 Other long term (current) drug therapy: Secondary | ICD-10-CM | POA: Diagnosis not present

## 2017-09-29 DIAGNOSIS — R8290 Unspecified abnormal findings in urine: Secondary | ICD-10-CM | POA: Diagnosis not present

## 2017-09-29 DIAGNOSIS — E538 Deficiency of other specified B group vitamins: Secondary | ICD-10-CM | POA: Diagnosis not present

## 2017-09-29 DIAGNOSIS — Z13220 Encounter for screening for lipoid disorders: Secondary | ICD-10-CM | POA: Diagnosis not present

## 2017-09-29 DIAGNOSIS — G43019 Migraine without aura, intractable, without status migrainosus: Secondary | ICD-10-CM | POA: Diagnosis not present

## 2017-09-29 DIAGNOSIS — Z720 Tobacco use: Secondary | ICD-10-CM | POA: Diagnosis not present

## 2017-09-29 DIAGNOSIS — Z1329 Encounter for screening for other suspected endocrine disorder: Secondary | ICD-10-CM | POA: Diagnosis not present

## 2017-10-03 ENCOUNTER — Telehealth: Payer: Self-pay

## 2017-10-03 ENCOUNTER — Encounter: Payer: Self-pay | Admitting: Obstetrics & Gynecology

## 2017-10-03 NOTE — Telephone Encounter (Signed)
Pt resch appt b/c she has two regular periods a month, migraines are terrible now, pretty sure she'd going through the change, feels like she's crazy.  Please call.  [phone]

## 2017-10-03 NOTE — Telephone Encounter (Signed)
Pt just wanted to let you know why she rescheduled her appt, pt aware this is fine we will see her ion the 5. Pt scheduled for pap. Didn't think we could do the pap with the bleeding. Pt aware she can discuss this irregular bleeding at the appt

## 2017-10-07 DIAGNOSIS — Z9189 Other specified personal risk factors, not elsewhere classified: Secondary | ICD-10-CM

## 2017-10-07 DIAGNOSIS — Z1371 Encounter for nonprocreative screening for genetic disease carrier status: Secondary | ICD-10-CM

## 2017-10-07 HISTORY — DX: Encounter for nonprocreative screening for genetic disease carrier status: Z13.71

## 2017-10-07 HISTORY — DX: Other specified personal risk factors, not elsewhere classified: Z91.89

## 2017-10-11 ENCOUNTER — Encounter: Payer: Self-pay | Admitting: Obstetrics & Gynecology

## 2017-10-11 ENCOUNTER — Ambulatory Visit (INDEPENDENT_AMBULATORY_CARE_PROVIDER_SITE_OTHER): Payer: BLUE CROSS/BLUE SHIELD | Admitting: Obstetrics & Gynecology

## 2017-10-11 VITALS — BP 98/60 | Ht 67.0 in | Wt 144.0 lb

## 2017-10-11 DIAGNOSIS — Z124 Encounter for screening for malignant neoplasm of cervix: Secondary | ICD-10-CM

## 2017-10-11 DIAGNOSIS — N926 Irregular menstruation, unspecified: Secondary | ICD-10-CM | POA: Diagnosis not present

## 2017-10-11 DIAGNOSIS — Z1231 Encounter for screening mammogram for malignant neoplasm of breast: Secondary | ICD-10-CM

## 2017-10-11 DIAGNOSIS — Z1239 Encounter for other screening for malignant neoplasm of breast: Secondary | ICD-10-CM

## 2017-10-11 DIAGNOSIS — R5383 Other fatigue: Secondary | ICD-10-CM | POA: Diagnosis not present

## 2017-10-11 DIAGNOSIS — Z8041 Family history of malignant neoplasm of ovary: Secondary | ICD-10-CM | POA: Diagnosis not present

## 2017-10-11 DIAGNOSIS — Z803 Family history of malignant neoplasm of breast: Secondary | ICD-10-CM | POA: Diagnosis not present

## 2017-10-11 MED ORDER — NORETHINDRONE 0.35 MG PO TABS: 1.0000 | ORAL_TABLET | Freq: Every day | ORAL | 11 refills | Status: DC

## 2017-10-11 NOTE — Progress Notes (Signed)
HPI:      [name] is a 47 y.o. [phone] who LMP was Patient's last menstrual period was 10/04/2017., she presents today for her annual examination. The patient has complaints of period changes, fatigue, mood changes, and hot flashes over the past year.  2 periods per month but no heavy or troublesome.  Prior ablation many years ago. . The patient is sexually active. Her last pap: approximate date 2015 and was normal and last mammogram: approximate date 2015 and was normal. The patient does perform self breast exams.  There is notable family history of breast or ovarian cancer in her family.  The patient has regular exercise: yes.  The patient denies current symptoms of depression.   FH Breast )mother 44) and ovarian (PGM).  GYN History: Contraception: tubal ligation  PMHx: Past Medical History:  Diagnosis Date  . Anemia   . Anxiety   . Depression   . GERD (gastroesophageal reflux disease)   . Migraine   . Septal defect, heart 2016   Dr. Saralyn Pilar septal hypokinesis  . Shortness of breath dyspnea    with activity   Past Surgical History:  Procedure Laterality Date  . BREAST BIOPSY Right 03-25-14   Stromal fibrosis and benign calcifications  . BREAST BIOPSY Left 02/28/2015   Procedure: BREAST BIOPSY WITH NEEDLE LOCALIZATION;  Surgeon:  Bellow, MD;  Location: [facility] ORS;  Service: General;  Laterality: Left;  . ENDOMETRIAL ABLATION    . TUBAL LIGATION  2007   Family History  Problem Relation Age of Onset  . Cancer Mother 24       breast  . Hypertension Father   . Ovarian cancer Maternal Grandmother    Social History   Tobacco Use  . Smoking status: Current Every Day Smoker    Packs/day: 1.00    Years: 25.00    Pack years: 25.00    Types: Cigarettes  . Smokeless tobacco: Never Used  Substance Use Topics  . Alcohol use: No  . Drug use: No    Current Outpatient Medications:  .  acetaminophen-codeine (TYLENOL #3) 300-30 MG tablet, Take 1 tablet by  mouth every 6 (six) hours as needed for moderate pain., Disp: 10 tablet, Rfl: 0 .  citalopram (CELEXA) 40 MG tablet, Take 40 mg by mouth at bedtime. , Disp: , Rfl:  .  ibuprofen (ADVIL,MOTRIN) 800 MG tablet, Take 800 mg by mouth every 8 (eight) hours as needed for headache or mild pain. , Disp: , Rfl:  .  norethindrone (MICRONOR,CAMILA,ERRIN) 0.35 MG tablet, Take 1 tablet (0.35 mg total) by mouth daily., Disp: 1 Package, Rfl: 11 .  predniSONE (DELTASONE) 10 MG tablet, Take 3 tablets once a day for 4 days, Disp: 12 tablet, Rfl: 0 .  SUMAtriptan (IMITREX) 100 MG tablet, Take 100 mg by mouth every 2 (two) hours as needed for migraine. May repeat in 2 hours if headache persists or recurs., Disp: , Rfl:  Allergies: Patient has no known allergies.  Review of Systems  Constitutional: Positive for malaise/fatigue. Negative for chills and fever.  HENT: Negative for congestion, sinus pain and sore throat.   Eyes: Negative for blurred vision and pain.  Respiratory: Positive for shortness of breath. Negative for cough and wheezing.   Cardiovascular: Negative for chest pain and leg swelling.  Gastrointestinal: Negative for abdominal pain, constipation, diarrhea, heartburn, nausea and vomiting.  Genitourinary: Negative for dysuria, frequency, hematuria and urgency.  Musculoskeletal: Negative for back pain, joint pain, myalgias and neck  pain.  Skin: Negative for itching and rash.  Neurological: Negative for dizziness, tremors and weakness.  Endo/Heme/Allergies: Does not bruise/bleed easily.  Psychiatric/Behavioral: Positive for depression. The patient is not nervous/anxious and does not have insomnia.     Objective: BP 98/60   Ht 5' 7"  (1.702 m)   Wt 144 lb (65.3 kg)   LMP 10/04/2017   BMI 22.55 kg/m   Filed Weights   10/11/17 1004  Weight: 144 lb (65.3 kg)   Body mass index is 22.55 kg/m. Physical Exam  Constitutional: She is oriented to person, place, and time. She appears well-developed and  well-nourished. No distress.  Genitourinary: Rectum normal, vagina normal and uterus normal. Pelvic exam was performed with patient supine. There is no rash or lesion on the right labia. There is no rash or lesion on the left labia. Vagina exhibits no lesion. No bleeding in the vagina. Right adnexum does not display mass and does not display tenderness. Left adnexum does not display mass and does not display tenderness. Cervix does not exhibit motion tenderness, lesion, friability or polyp.   Uterus is mobile and midaxial. Uterus is not enlarged or exhibiting a mass.  HENT:  Head: Normocephalic and atraumatic. Head is without laceration.  Right Ear: Hearing normal.  Left Ear: Hearing normal.  Nose: No epistaxis.  No foreign bodies.  Mouth/Throat: Uvula is midline, oropharynx is clear and moist and mucous membranes are normal.  Eyes: Pupils are equal, round, and reactive to light.  Neck: Normal range of motion. Neck supple. No thyromegaly present.  Cardiovascular: Normal rate and regular rhythm. Exam reveals no gallop and no friction rub.  No murmur heard. Pulmonary/Chest: Effort normal and breath sounds normal. No respiratory distress. She has no wheezes.  Abdominal: Soft. Bowel sounds are normal. She exhibits no distension. There is no tenderness. There is no rebound.  Musculoskeletal: Normal range of motion.  Neurological: She is alert and oriented to person, place, and time. No cranial nerve deficit.  Skin: Skin is warm and dry.  Psychiatric: She has a normal mood and affect. Judgment normal.  Vitals reviewed.  Assessment:  ANNUAL EXAM 1. Screening for cervical cancer   2. Screening for breast cancer   3. Irregular menses   4. Other fatigue    Screening Plan:            1.  Cervical Screening-  Pap smear done today  2. Breast screening- Exam annually and mammogram>40 planned   3. Colonoscopy every 10 years, Hemoccult testing - after age 40  4. Labs managed by PCP  5.  Counseling for contraception: bilateral tubal ligation  Other:  1. Screening for cervical cancer - IGP, Aptima HPV  2. Screening for breast cancer - MM DIGITAL SCREENING BILATERAL; Future  3. Irregular menses - Hormone therapy discussed for all these sx's.  Also, ablation.  Plan norethinedrone (avoid estrogen due to tob use and age) - TSH  4. Other fatigue - TSH  5. Genetic risk for breast and ovarian cancer -  She presents with a significant personal and/or family history of breast  And ovarian. Details of which can be found in her medical/family history. She does not have a previously identified BRCA and Lynch syndrome mutation in her family. Due to her personal and/or family history of cancer she is a candidate for the [facility] test(s).    Risk for cancer, genetic susceptibility discussed.  Patient has requested gene testing.  Discussed BRCA as well as Lynch syndrome and other  cancer risk assessments available based on her family history and personal history. Pros and cons of testing discussed.     F/U  Return in about 1 year (around 10/12/2018) for Annual.  Barnett Applebaum, MD, [facility], [facility] 10/11/2017  10:43 AM

## 2017-10-11 NOTE — Patient Instructions (Signed)
PAP every three years Mammogram every year    Call [phone] to schedule at [facility] yearly (with PCP)  Norethindrone acetate (hormone replacement) What is this medicine? NORETHINDRONE ACETATE (nor eth IN drone AS e tate) is a female hormone. This medicine is used to treat endometriosis, uterine bleeding caused by abnormal hormone levels, and secondary amenorrhea. Secondary amenorrhea is when a woman stops getting menstrual periods due to low levels of certain female hormones. This medicine may be used for other purposes; ask your health care provider or pharmacist if you have questions. COMMON BRAND NAME(S): Aygestin What should I tell my health care provider before I take this medicine? They need to know if you have any of these conditions: -blood vessel disease or blood clots -breast, cervical, or vaginal cancer -diabetes -heart disease -kidney disease -liver disease -mental depression -migraine -seizures -stroke -vaginal bleeding -an unusual or allergic reaction to norethindrone, other medicines, foods, dyes, or preservatives -pregnant or trying to get pregnant -breast-feeding How should I use this medicine? Take this medicine by mouth with a glass of water. You may take this medicine with or without food. Follow the directions on the prescription label. Take this medicine at the same time each day. Do not take your medicine more often than directed. A patient information sheet will be given with each prescription and refill. Read this sheet carefully each time. The sheet may change frequently. Talk to your pediatrician regarding the use of this medicine in children. Special care may be needed. Overdosage: If you think you have taken too much of this medicine contact a poison control center or emergency room at once. NOTE: This medicine is only for you. Do not share this medicine with others. What if I miss a dose? If you miss a dose, take it as soon as you can. If it is  almost time for your next dose, take only that dose. Do not take double or extra doses. What may interact with this medicine? Do not take this medicine with any of the following medications: -amprenavir or fosamprenavir -bosentan This medicine may also interact with the following medications: -antibiotics or medicines for infections, especially rifampin, rifabutin, rifapentine, and griseofulvin, and possibly penicillins or tetracyclines -aprepitant -barbiturate medicines, such as phenobarbital -carbamazepine -felbamate -modafinil -oxcarbazepine -phenytoin -ritonavir or other medicines for HIV infection or AIDS -St. John's wort -topiramate This list may not describe all possible interactions. Give your health care provider a list of all the medicines, herbs, non-prescription drugs, or dietary supplements you use. Also tell them if you smoke, drink alcohol, or use illegal drugs. Some items may interact with your medicine. What should I watch for while using this medicine? Visit your doctor or health care professional for regular checks on your progress. You will need a regular breast and pelvic exam and Pap smear while on this medicine. If you have any reason to think you are pregnant, stop taking this medicine right away and contact your doctor or health care professional. If you are taking this medicine for hormone related problems, it may take several cycles of use to see improvement in your condition. What side effects may I notice from receiving this medicine? Side effects that you should report to your doctor or health care professional as soon as possible: -breast tenderness or discharge -pain in the abdomen, chest, groin or leg -severe headache -skin rash, itching, or hives -sudden shortness of breath -unusually weak or tired -vision or speech problems -yellowing of skin or eyes Side effects  that usually do not require medical attention (report to your doctor or health care  professional if they continue or are bothersome): -changes in sexual desire -change in menstrual flow -facial hair growth -fluid retention and swelling -headache -irritability -nausea -weight gain or loss This list may not describe all possible side effects. Call your doctor for medical advice about side effects. You may report side effects to FDA at 1-800-FDA-1088. Where should I keep my medicine? Keep out of the reach of children. Store at room temperature between 15 and 30 degrees C (59 and 86 degrees F). Throw away any unused medicine after the expiration date. NOTE: This sheet is a summary. It may not cover all possible information. If you have questions about this medicine, talk to your doctor, pharmacist, or health care provider.  2018 Elsevier/Gold Standard (2008-02-19 14:38:36)

## 2017-10-12 LAB — TSH: TSH: 1.27 u[IU]/mL (ref 0.450–4.500)

## 2017-10-14 ENCOUNTER — Other Ambulatory Visit: Payer: Self-pay | Admitting: Obstetrics & Gynecology

## 2017-10-14 LAB — IGP, APTIMA HPV
HPV APTIMA: NEGATIVE
PAP SMEAR COMMENT: 0

## 2017-10-14 MED ORDER — TINIDAZOLE 500 MG PO TABS: 2.0000 g | ORAL_TABLET | Freq: Once | ORAL | 1 refills | Status: AC

## 2017-10-14 NOTE — Progress Notes (Signed)
PAP d/w pt, Trich diagnosed and its implications Tinidazole 2g eRx Partner tx encouraged F/U testing > 2 weeks for test of cure Pt has no sx's  Barnett Applebaum, MD, [facility] 10/14/2017  8:00 AM

## 2017-10-18 ENCOUNTER — Encounter: Payer: Self-pay | Admitting: Obstetrics and Gynecology

## 2017-10-20 ENCOUNTER — Encounter: Payer: Self-pay | Admitting: Obstetrics and Gynecology

## 2017-10-27 ENCOUNTER — Other Ambulatory Visit: Payer: Self-pay | Admitting: Internal Medicine

## 2017-10-27 DIAGNOSIS — R519 Headache, unspecified: Secondary | ICD-10-CM

## 2017-10-27 DIAGNOSIS — R51 Headache: Principal | ICD-10-CM

## 2017-11-07 ENCOUNTER — Ambulatory Visit: Payer: BLUE CROSS/BLUE SHIELD

## 2017-11-11 ENCOUNTER — Telehealth: Payer: Self-pay | Admitting: Obstetrics & Gynecology

## 2017-11-11 NOTE — Telephone Encounter (Signed)
Called and left voice mail for patient to call back to be schedule

## 2017-11-11 NOTE — Telephone Encounter (Signed)
-----   Message from Gae Dry, MD sent at 11/11/2017  2:56 PM EDT ----- Regarding: Appt Sch appt to discuss lab results

## 2017-11-17 NOTE — Telephone Encounter (Signed)
Called and left voice mail for patient to call back to be schedule

## 2017-11-21 ENCOUNTER — Encounter: Payer: Self-pay | Admitting: Obstetrics & Gynecology

## 2017-11-21 ENCOUNTER — Ambulatory Visit (INDEPENDENT_AMBULATORY_CARE_PROVIDER_SITE_OTHER): Payer: BLUE CROSS/BLUE SHIELD | Admitting: Obstetrics & Gynecology

## 2017-11-21 VITALS — BP 90/60 | Wt 142.0 lb

## 2017-11-21 DIAGNOSIS — Z8619 Personal history of other infectious and parasitic diseases: Secondary | ICD-10-CM | POA: Diagnosis not present

## 2017-11-21 DIAGNOSIS — Z803 Family history of malignant neoplasm of breast: Secondary | ICD-10-CM | POA: Diagnosis not present

## 2017-11-21 DIAGNOSIS — Z1231 Encounter for screening mammogram for malignant neoplasm of breast: Secondary | ICD-10-CM | POA: Diagnosis not present

## 2017-11-21 NOTE — Progress Notes (Signed)
History of Present Illness:  Anita Nelson is a 47 y.o. who was treated for Trich as found on PAP last month.  Has not been sexually active since tx. Denies d/c, pain, itch, burn.  Also presents after testing for BRCA/MyRisk genetic cancer risk.  PMHx: She  has a past medical history of Anemia, Anxiety, BRCA negative (10/2017), Depression, Family history of breast cancer, Family history of ovarian cancer, GERD (gastroesophageal reflux disease), Increased risk of breast cancer (10/2017), Migraine, Septal defect, heart (2016), and Shortness of breath dyspnea. Also,  has a past surgical history that includes Tubal ligation (2007); Breast biopsy (Right, 03-25-14); Breast biopsy (Left, 02/28/2015); and Endometrial ablation., family history includes Breast cancer (age of onset: 25) in her mother; Hypertension in her father; Ovarian cancer in her paternal grandmother.,  reports that she has been smoking cigarettes.  She has a 25.00 pack-year smoking history. She has never used smokeless tobacco. She reports that she does not drink alcohol or use drugs. No outpatient medications have been marked as taking for the 11/21/17 encounter (Office Visit) with Gae Dry, MD.  . Also, has No Known Allergies..  Review of Systems  All other systems reviewed and are negative.  Physical Exam:  BP 90/60   Wt 142 lb (64.4 kg)   LMP 11/11/2017   BMI 22.24 kg/m  Body mass index is 22.24 kg/m. Constitutional: Well nourished, well developed female in no acute distress.  Abdomen: diffusely non tender to palpation, non distended, and no masses, hernias Neuro: Grossly intact Psych:  Normal mood and affect.   Physical Exam  Genitourinary: Vagina normal and uterus normal. Pelvic exam was performed with patient supine. There is no rash or tenderness on the right labia. There is no rash or tenderness on the left labia. No tenderness in the vagina. No vaginal discharge found. Right adnexum does not display mass and does  not display tenderness. Left adnexum does not display mass and does not display tenderness. Cervix does not exhibit motion tenderness or discharge.   Uterus is midaxial. Uterus is not enlarged or exhibiting a mass.   Assessment:  Visit Diagnoses    History of trichomoniasis    -  Primary   Relevant Orders   Trichomonas vaginalis, RNA   Family history of breast cancer in first degree relative       Encounter for screening mammogram for malignant neoplasm of breast       Relevant Orders   MR BREAST BILATERAL W WO CONTRAST INC CAD    Plan: She will undergo re-test for Trich today  Follow up discussion regarding recent genetic testing due to familial high risk factors.  Her lab genetic panel was negative.  Variant (VUS) results discussed.  Reassurance provided and counseling as to the pros and cons of testing was done today.  Since the current test is not perfect, it is possible that there may be a gene mutation that current testing cannot detect, but that chance is small. It is possible that a different genetic factor, which has not yet been discovered or is not on this panel, is responsible for the cancer diagnoses in the family. Again, the likelihood of this is low.   Most cancers happen by chance and this test, along with details of her family history, suggests that her cancer falls into this category. She is recommended to follow the cancer screening guidelines provided by her physician.  Breast cancer risk is further calculated based on the T-C model.  Even  with negative gene testing, she still has a risk of 25% based on this calculation.  Recommendations are for yearly breast exams, mammograms, and MRI when this risk is > 20%.    Will arrange breast MRI soon.  MMG sch for tomorrow.  A total of 15 minutes were spent face-to-face with the patient during this encounter and over half of that time dealt with counseling and coordination of care.  Barnett Applebaum, MD, [facility],  [facility] 11/21/2017  10:58 AM

## 2017-11-23 LAB — TRICHOMONAS VAGINALIS, PROBE AMP: Trich vag by NAA: NEGATIVE

## 2018-02-21 ENCOUNTER — Ambulatory Visit
Admission: RE | Admit: 2018-02-21 | Discharge: 2018-02-21 | Disposition: A | Discharge status: Home / Self Care | Payer: BLUE CROSS/BLUE SHIELD | Source: Ambulatory Visit | Attending: Obstetrics & Gynecology | Admitting: Obstetrics & Gynecology

## 2018-02-21 ENCOUNTER — Other Ambulatory Visit: Payer: Self-pay | Admitting: Obstetrics & Gynecology

## 2018-02-21 DIAGNOSIS — Z1231 Encounter for screening mammogram for malignant neoplasm of breast: Secondary | ICD-10-CM | POA: Diagnosis not present

## 2018-02-21 DIAGNOSIS — R921 Mammographic calcification found on diagnostic imaging of breast: Secondary | ICD-10-CM

## 2018-02-21 DIAGNOSIS — R928 Other abnormal and inconclusive findings on diagnostic imaging of breast: Secondary | ICD-10-CM

## 2018-02-21 DIAGNOSIS — Z1239 Encounter for other screening for malignant neoplasm of breast: Secondary | ICD-10-CM

## 2018-02-22 ENCOUNTER — Other Ambulatory Visit: Payer: Self-pay | Admitting: Obstetrics & Gynecology

## 2018-02-22 DIAGNOSIS — R928 Other abnormal and inconclusive findings on diagnostic imaging of breast: Secondary | ICD-10-CM

## 2018-03-07 ENCOUNTER — Other Ambulatory Visit: Payer: BLUE CROSS/BLUE SHIELD

## 2018-03-07 ENCOUNTER — Ambulatory Visit: Payer: BLUE CROSS/BLUE SHIELD

## 2018-03-14 ENCOUNTER — Ambulatory Visit
Admission: RE | Admit: 2018-03-14 | Discharge: 2018-03-14 | Disposition: A | Discharge status: Home / Self Care | Payer: BLUE CROSS/BLUE SHIELD | Source: Ambulatory Visit | Attending: Obstetrics & Gynecology | Admitting: Obstetrics & Gynecology

## 2018-03-14 DIAGNOSIS — R921 Mammographic calcification found on diagnostic imaging of breast: Secondary | ICD-10-CM

## 2018-03-14 DIAGNOSIS — R928 Other abnormal and inconclusive findings on diagnostic imaging of breast: Secondary | ICD-10-CM

## 2018-03-16 ENCOUNTER — Other Ambulatory Visit: Payer: Self-pay | Admitting: Obstetrics & Gynecology

## 2018-03-16 DIAGNOSIS — R921 Mammographic calcification found on diagnostic imaging of breast: Secondary | ICD-10-CM

## 2018-03-16 DIAGNOSIS — R928 Other abnormal and inconclusive findings on diagnostic imaging of breast: Secondary | ICD-10-CM

## 2018-03-21 DIAGNOSIS — Z0000 Encounter for general adult medical examination without abnormal findings: Secondary | ICD-10-CM | POA: Diagnosis not present

## 2018-03-21 DIAGNOSIS — F3341 Major depressive disorder, recurrent, in partial remission: Secondary | ICD-10-CM | POA: Diagnosis not present

## 2018-03-21 DIAGNOSIS — G43019 Migraine without aura, intractable, without status migrainosus: Secondary | ICD-10-CM | POA: Diagnosis not present

## 2018-03-21 DIAGNOSIS — R0602 Shortness of breath: Secondary | ICD-10-CM | POA: Diagnosis not present

## 2018-03-29 ENCOUNTER — Ambulatory Visit
Admission: RE | Admit: 2018-03-29 | Discharge: 2018-03-29 | Disposition: A | Discharge status: Home / Self Care | Payer: BLUE CROSS/BLUE SHIELD | Source: Ambulatory Visit | Attending: Obstetrics & Gynecology | Admitting: Obstetrics & Gynecology

## 2018-03-29 DIAGNOSIS — R928 Other abnormal and inconclusive findings on diagnostic imaging of breast: Secondary | ICD-10-CM

## 2018-03-29 DIAGNOSIS — R921 Mammographic calcification found on diagnostic imaging of breast: Secondary | ICD-10-CM | POA: Diagnosis not present

## 2018-03-29 HISTORY — PX: BREAST BIOPSY: SHX20

## 2018-03-31 ENCOUNTER — Telehealth: Payer: Self-pay

## 2018-03-31 ENCOUNTER — Other Ambulatory Visit: Payer: Self-pay | Admitting: Obstetrics & Gynecology

## 2018-03-31 DIAGNOSIS — D249 Benign neoplasm of unspecified breast: Secondary | ICD-10-CM

## 2018-03-31 NOTE — Telephone Encounter (Signed)
Mechele Claude from [facility] called.  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  Needs surgical referral.  [phone]

## 2018-03-31 NOTE — Telephone Encounter (Signed)
Referral Dr Bary Castilla

## 2018-03-31 NOTE — Telephone Encounter (Signed)
LM on pt's cell to call back

## 2018-04-06 ENCOUNTER — Encounter: Payer: Self-pay | Admitting: General Surgery

## 2018-04-06 ENCOUNTER — Ambulatory Visit (INDEPENDENT_AMBULATORY_CARE_PROVIDER_SITE_OTHER): Payer: BLUE CROSS/BLUE SHIELD | Admitting: General Surgery

## 2018-04-06 VITALS — BP 124/70 | HR 80 | Resp 14 | Ht 67.0 in | Wt 137.0 lb

## 2018-04-06 DIAGNOSIS — D241 Benign neoplasm of right breast: Secondary | ICD-10-CM

## 2018-04-06 NOTE — Progress Notes (Signed)
Patient ID: Anita Nelson, female   DOB: 1971/02/03, 47 y.o.   MRN: [id number]  Chief Complaint  Patient presents with  . Other    HPI Anita Nelson is a 47 y.o. female who presents for a breast evaluation. The most recent mammogram was done on 02/21/2018 and added views on 03/14/2018 right breast biopsy done 03/29/2018.  Patient does perform regular self breast checks and gets regular mammograms done.    HPI  Past Medical History:  Diagnosis Date  . Anemia   . Anxiety   . BRCA negative 10/2017   MyRisk neg except VUS in AXIN2, MSH3, RNF43  . Depression   . Family history of breast cancer   . Family history of ovarian cancer   . GERD (gastroesophageal reflux disease)   . Increased risk of breast cancer 10/2017   IBIS=25%  . Migraine   . Septal defect, heart 2016   Dr. Saralyn Pilar septal hypokinesis  . Shortness of breath dyspnea    with activity    Past Surgical History:  Procedure Laterality Date  . BREAST BIOPSY Right 03-25-14   Stromal fibrosis and benign calcifications  . BREAST BIOPSY Left 02/28/2015   Procedure: BREAST BIOPSY WITH NEEDLE LOCALIZATION;  Surgeon: Robert Bellow, MD;  Location: [facility] ORS;  Service: General;  Laterality: Left;  . BREAST BIOPSY Right 03/29/2018   affirm bx right x clip   path pending  . BREAST EXCISIONAL BIOPSY Left 2016   benign  . ENDOMETRIAL ABLATION    . TUBAL LIGATION  2007    Family History  Problem Relation Age of Onset  . Breast cancer Mother 69  . Hypertension Father   . Ovarian cancer Paternal Grandmother     Social History Social History   Tobacco Use  . Smoking status: Current Every Day Smoker    Packs/day: 1.00    Years: 25.00    Pack years: 25.00    Types: Cigarettes  . Smokeless tobacco: Never Used  Substance Use Topics  . Alcohol use: No  . Drug use: No    No Known Allergies  Current Outpatient Medications  Medication Sig Dispense Refill  . citalopram (CELEXA) 40 MG tablet Take 40 mg by mouth at  bedtime.     Marland Kitchen ibuprofen (ADVIL,MOTRIN) 800 MG tablet Take 800 mg by mouth every 8 (eight) hours as needed for headache or mild pain.     Marland Kitchen norethindrone (MICRONOR,CAMILA,ERRIN) 0.35 MG tablet Take 1 tablet (0.35 mg total) by mouth daily. 1 Package 11  . SUMAtriptan (IMITREX) 100 MG tablet Take 100 mg by mouth every 2 (two) hours as needed for migraine. May repeat in 2 hours if headache persists or recurs.     No current facility-administered medications for this visit.     Review of Systems Review of Systems  Constitutional: Negative.   Respiratory: Negative.   Cardiovascular: Negative.     Blood pressure 124/70, pulse 80, resp. rate 14, height 5' 7"  (1.702 m), weight 137 lb (62.1 kg), last menstrual period 03/27/2018.  Physical Exam Physical Exam  Constitutional: She is oriented to person, place, and time. She appears well-developed and well-nourished.  Eyes: Conjunctivae are normal. No scleral icterus.  Neck: Neck supple.  Cardiovascular: Normal rate, regular rhythm and normal heart sounds.  Pulmonary/Chest: Effort normal and breath sounds normal. Right breast exhibits no inverted nipple, no mass, no nipple discharge, no skin change and no tenderness. Left breast exhibits no inverted nipple, no mass, no nipple discharge, no  skin change and no tenderness.  Lymphadenopathy:    She has no cervical adenopathy.    She has no axillary adenopathy.  Neurological: She is alert and oriented to person, place, and time.  Skin: Skin is warm and dry.    Data Reviewed March 25, 2014 stereotactic biopsy of the retroareolar area of the left breast showed stromal fibrosis and benign mammary epithelium with scattered microcalcifications. Subsequent surgical excision of this area for persistent calcifications completed in February 28, 2015 showed columnar cell changes with usual ductal hyperplasia.  1 mm sclerotic micro-papilloma. Stereotactic biopsy of the right breast dated March 29, 2018: A.  BREAST, RIGHT; STEREOTACTIC BIOPSY:  - INTRADUCTAL PAPILLOMA WITH APOCRINE METAPLASIA, USUAL DUCTAL  HYPERPLASIA, AND ASSOCIATED CALCIFICATIONS.  - COLUMNAR CELL CHANGE WITH ASSOCIATED CALCIFICATIONS.  - FIBROADENOMATOID CHANGE.  - PSEUDOANGIOMATOUS STROMAL HYPERPLASIA.  - NEGATIVE FOR ATYPIA AND MALIGNANCY.  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    Plan Options for management reviewed: 1) surgical excision versus 2) observation.  With the patient's past history of a papilloma in the contralateral breast, complete removal of the area of microcalcifications which prompted initial biopsy as well as the large volume of tissue removed, the likelihood for upstaging from formal excision is small.  Pros and cons of excision were discussed, which she is familiar with from management of the left breast microcalcifications.  At present the patient is comfortable with observation.  She is aware that if she changes her mind she is welcome to call and we will arrange for surgical excision.  Patient to return in six months right breast diagnotic mammogram. The patient is aware to call back for any questions or concerns.  The patient's mother is white, her father is black.  After the patient had left the office, I calculated her risk assessment making use of the [facility] model risk assessment tool.  Calculation was completed for each race separately.  Black: 2.6/15.5.  White: 4.2, 26.5.  Both would meet criteria for possible chemoprevention with tamoxifen.  We will review this option at follow-up.  HPI, Physical Exam, Assessment and Plan have been scribed under the direction and in the presence of Hervey Ard, MD.  Gaspar Cola, CMA  I have completed the exam and reviewed the above documentation for accuracy and completeness.  I agree with the above.  Haematologist has been used and any errors in dictation  or transcription are unintentional.  Hervey Ard, M.D., F.A.C.S.  Forest Gleason Raheel Kunkle 04/07/2018, 7:13 AM

## 2018-04-06 NOTE — Patient Instructions (Signed)
  Patient to return in six months right breast diagnotic mammogram. The patient is aware to call back for any questions or concerns.

## 2018-04-07 DIAGNOSIS — D241 Benign neoplasm of right breast: Secondary | ICD-10-CM | POA: Insufficient documentation

## 2018-04-07 LAB — SURGICAL PATHOLOGY

## 2018-07-09 ENCOUNTER — Encounter: Payer: Self-pay | Admitting: Emergency Medicine

## 2018-07-09 ENCOUNTER — Emergency Department
Admission: EM | Admit: 2018-07-09 | Discharge: 2018-07-09 | Discharge status: Against Medical Advice | Payer: BLUE CROSS/BLUE SHIELD | Attending: Emergency Medicine | Admitting: Emergency Medicine

## 2018-07-09 DIAGNOSIS — Z5321 Procedure and treatment not carried out due to patient leaving prior to being seen by health care provider: Secondary | ICD-10-CM | POA: Insufficient documentation

## 2018-07-09 DIAGNOSIS — K0889 Other specified disorders of teeth and supporting structures: Secondary | ICD-10-CM | POA: Insufficient documentation

## 2018-07-09 NOTE — ED Triage Notes (Signed)
Patient with complaint of left lower dental pain that started on Thursday.

## 2018-08-08 ENCOUNTER — Other Ambulatory Visit: Payer: Self-pay

## 2018-08-08 DIAGNOSIS — D241 Benign neoplasm of right breast: Secondary | ICD-10-CM

## 2018-09-15 ENCOUNTER — Other Ambulatory Visit: Payer: BLUE CROSS/BLUE SHIELD

## 2018-09-19 ENCOUNTER — Other Ambulatory Visit: Payer: BLUE CROSS/BLUE SHIELD

## 2018-09-19 ENCOUNTER — Inpatient Hospital Stay: Admission: RE | Admit: 2018-09-19 | Payer: BLUE CROSS/BLUE SHIELD | Source: Ambulatory Visit

## 2018-09-21 ENCOUNTER — Ambulatory Visit: Payer: BLUE CROSS/BLUE SHIELD | Admitting: General Surgery

## 2018-09-21 ENCOUNTER — Other Ambulatory Visit: Payer: Self-pay | Admitting: Surgery

## 2018-09-21 DIAGNOSIS — D241 Benign neoplasm of right breast: Secondary | ICD-10-CM

## 2018-10-10 ENCOUNTER — Other Ambulatory Visit: Payer: BLUE CROSS/BLUE SHIELD

## 2018-10-10 ENCOUNTER — Inpatient Hospital Stay: Admission: RE | Admit: 2018-10-10 | Payer: BLUE CROSS/BLUE SHIELD | Source: Ambulatory Visit

## 2018-10-16 ENCOUNTER — Telehealth: Payer: Self-pay | Admitting: *Deleted

## 2018-10-16 NOTE — Telephone Encounter (Signed)
Left message letting patient know that her appointment for 10/17/18 has been canceled because she was a no show for mammogram. She needs to reschedule her mammogram then call us back to reschedule with Dr.Byrnett.

## 2018-10-17 ENCOUNTER — Ambulatory Visit: Payer: BLUE CROSS/BLUE SHIELD | Admitting: General Surgery

## 2019-01-22 ENCOUNTER — Encounter: Payer: Self-pay | Admitting: *Deleted

## 2019-02-13 DIAGNOSIS — K529 Noninfective gastroenteritis and colitis, unspecified: Secondary | ICD-10-CM | POA: Diagnosis not present

## 2019-07-31 ENCOUNTER — Telehealth: Payer: Self-pay | Admitting: Obstetrics & Gynecology

## 2019-07-31 NOTE — Telephone Encounter (Signed)
Patient scheduled for med follow up tele on 1/6, needs refill on Celexa, only one left.  [facility].

## 2019-08-01 ENCOUNTER — Other Ambulatory Visit: Payer: Self-pay | Admitting: Obstetrics & Gynecology

## 2019-08-01 MED ORDER — CITALOPRAM HYDROBROMIDE 40 MG PO TABS: 40.0000 mg | ORAL_TABLET | Freq: Every day | ORAL | 1 refills | Status: DC

## 2019-08-01 NOTE — Telephone Encounter (Signed)
She has not been seen here in a while, as far as I can tell.   I need to see her for an annual womens care visit.  Also, I have not prescribed Celexa, I believe her PCP or Dr Doy Hutching is following that.  See what she says about that.

## 2019-08-01 NOTE — Telephone Encounter (Signed)
Called and spoke with patient about getting schedule for annual. Patient states she is not seeing Dr. Stacie Glaze at ths time because she lost her job due to the pandemic. Patient's on unemployment and has limited funds and is able to pay for prescription due to it being Four dollars at her pharmacy. Patient is hoping you could send in medication to get her thru Christmas. Please advise Patient has schedule annual for 09/03/19 at 2:30 with [facility]

## 2019-08-01 NOTE — Telephone Encounter (Signed)
Pt aware.

## 2019-08-01 NOTE — Telephone Encounter (Signed)
ERx done, Thx

## 2019-08-15 ENCOUNTER — Ambulatory Visit: Payer: BLUE CROSS/BLUE SHIELD | Admitting: Obstetrics & Gynecology

## 2019-09-03 ENCOUNTER — Ambulatory Visit: Payer: BLUE CROSS/BLUE SHIELD | Admitting: Obstetrics & Gynecology

## 2019-09-17 ENCOUNTER — Ambulatory Visit: Payer: BLUE CROSS/BLUE SHIELD | Admitting: Obstetrics & Gynecology

## 2019-09-26 ENCOUNTER — Ambulatory Visit: Payer: BLUE CROSS/BLUE SHIELD

## 2019-11-22 ENCOUNTER — Ambulatory Visit: Payer: BLUE CROSS/BLUE SHIELD | Attending: Internal Medicine

## 2019-11-23 ENCOUNTER — Ambulatory Visit: Payer: BLUE CROSS/BLUE SHIELD

## 2019-12-31 ENCOUNTER — Encounter: Payer: Self-pay | Admitting: Obstetrics & Gynecology

## 2019-12-31 ENCOUNTER — Telehealth (INDEPENDENT_AMBULATORY_CARE_PROVIDER_SITE_OTHER): Payer: BLUE CROSS/BLUE SHIELD | Admitting: Obstetrics & Gynecology

## 2019-12-31 DIAGNOSIS — F419 Anxiety disorder, unspecified: Secondary | ICD-10-CM | POA: Diagnosis not present

## 2019-12-31 DIAGNOSIS — G43709 Chronic migraine without aura, not intractable, without status migrainosus: Secondary | ICD-10-CM

## 2019-12-31 MED ORDER — SUMATRIPTAN SUCCINATE 100 MG PO TABS: 100.0000 mg | ORAL_TABLET | ORAL | 0 refills | Status: AC | PRN

## 2019-12-31 MED ORDER — CITALOPRAM HYDROBROMIDE 40 MG PO TABS: 40.0000 mg | ORAL_TABLET | Freq: Every day | ORAL | 1 refills | Status: DC

## 2019-12-31 NOTE — Progress Notes (Signed)
Virtual Visit via Video Note  I connected with Anita Nelson on 12/31/19 at  2:30 PM EDT by a video enabled telemedicine application and verified that I am speaking with the correct person using two identifiers.  Location: Patient: Home Provider: Office   I discussed the limitations of evaluation and management by telemedicine and the availability of in person appointments. The patient expressed understanding and agreed to proceed.  History of Present Illness: Anita Nelson is a 49 y.o. who was has been on migraine medicine and Celexa for many years, and does not have a PCP any longer.  She has been off of meds for a few days now and feels side effects and ill effects coming on.  She currently has been dx w Covid 19 2 days ago and cannot come into office.  Also may feel effects due to covid19. Since that time, she states that her symptoms are usually well controlled on the meds she has been taking.  PMHx: She  has a past medical history of Anemia, Anxiety, BRCA negative (10/2017), Depression, Family history of breast cancer, Family history of ovarian cancer, GERD (gastroesophageal reflux disease), Increased risk of breast cancer (10/2017), Migraine, Septal defect, heart (2016), and Shortness of breath dyspnea. Also,  has a past surgical history that includes Tubal ligation (2007); Endometrial ablation; Breast biopsy (Right, 03-25-14); Breast biopsy (Left, 02/28/2015); Breast excisional biopsy (Left, 2016); and Breast biopsy (Right, 03/29/2018)., family history includes Breast cancer (age of onset: 56) in her mother; Hypertension in her father; Ovarian cancer in her paternal grandmother.,  reports that she has been smoking cigarettes. She has a 25.00 pack-year smoking history. She has never used smokeless tobacco. She reports that she does not drink alcohol or use drugs. No outpatient medications have been marked as taking for the 12/31/19 encounter (Video Visit) with Gae Dry, MD.  . Also, has  No Known Allergies..  Review of Systems  Constitutional: Positive for malaise/fatigue.  Respiratory: Positive for cough.   Neurological: Positive for headaches.  All other systems reviewed and are negative.   Observations/Objective: No exam today, due to telephone eVisit due to [facility] virus restriction on elective visits and procedures.  Prior visits reviewed along with ultrasounds/labs as indicated.  Assessment and Plan:   ICD-10-CM   1. Anxiety  F41.9   2. Chronic migraine without aura without status migrainosus, not intractable  G43.709   Meds renewed    Celexa, pris and cons discussed    Imitrex discussed Encouraged in office appt fro Annual and exam Encouraged to find PCP for long term management of Anxiety and Migraines She is off of POP at this time MMG due, after recovers from Covid  Follow Up Instructions: Annual Exam   I discussed the assessment and treatment plan with the patient. The patient was provided an opportunity to ask questions and all were answered. The patient agreed with the plan and demonstrated an understanding of the instructions.   The patient was advised to call back or seek an in-person evaluation if the symptoms worsen or if the condition fails to improve as anticipated.  A total of 20 minutes were spent face-to-face with the patient as well as preparation, review, communication, and documentation during this encounter.   Barnett Applebaum, MD, [facility], [facility] 12/31/2019  2:45 PM

## 2020-02-05 ENCOUNTER — Ambulatory Visit: Payer: Self-pay | Attending: Oncology

## 2020-02-27 ENCOUNTER — Other Ambulatory Visit: Payer: Self-pay

## 2020-02-27 ENCOUNTER — Ambulatory Visit: Payer: Self-pay | Attending: Oncology

## 2020-02-27 ENCOUNTER — Ambulatory Visit
Admission: RE | Admit: 2020-02-27 | Discharge: 2020-02-27 | Disposition: A | Discharge status: Home / Self Care | Payer: Self-pay | Source: Ambulatory Visit | Attending: Oncology | Admitting: Oncology

## 2020-02-27 VITALS — BP 104/55 | HR 79 | Temp 98.2°F | Ht 67.25 in | Wt 133.2 lb

## 2020-02-27 DIAGNOSIS — Z9889 Other specified postprocedural states: Secondary | ICD-10-CM | POA: Insufficient documentation

## 2020-02-27 DIAGNOSIS — R920 Mammographic microcalcification found on diagnostic imaging of breast: Secondary | ICD-10-CM

## 2020-02-27 NOTE — Progress Notes (Signed)
  Subjective:     Patient ID: Anita Nelson, female   DOB: [birth year], 49 y.o.   MRN: [id number]  HPI   Review of Systems     Objective:   Physical Exam Chest:     Breasts:        Right: No swelling, bleeding, inverted nipple, mass, nipple discharge, skin change or tenderness.        Left: No swelling, bleeding, inverted nipple, mass, nipple discharge, skin change or tenderness.        Assessment:     49 year old patient presents for [facility] clinic visit.  Patient screened, and meets BCCCP eligibility.  Patient does not have insurance, Medicare or Medicaid.  Instructed patient on breast self awareness using teach back method.  Clinical breast exam unremarkable.  No mass or lump palpated.  Patient states she had  Bilateral breast biopsies but did not return for post biopsy mammogram. Risk Assessment    Risk Scores      02/27/2020   Last edited by: Orson Slick, CMA   5-year risk: 1.9 %   Lifetime risk: 15.2 %             Plan:     Sent for bilateral diagnostic mammogram, and ultrasound.

## 2020-02-28 NOTE — Progress Notes (Signed)
Letter mailed from [facility] to notify of normal mammogram results.  Patient to return in one year for annual screening.  Copy to [facility].

## 2020-04-21 ENCOUNTER — Other Ambulatory Visit: Payer: Self-pay | Admitting: Obstetrics & Gynecology

## 2020-04-22 ENCOUNTER — Other Ambulatory Visit: Payer: Self-pay | Admitting: Obstetrics & Gynecology

## 2020-10-28 NOTE — Progress Notes (Signed)
Idelle Crouch, MD   Chief Complaint  Patient presents with  . Urinary Tract Infection    Urinary frequency, no burning, vaginal pain when sitting, ammonia odor    HPI:      Ms. ANAYSIA GERMER is a 50 y.o. [phone] whose LMP was No LMP recorded. Patient is perimenopausal., presents today for UTI sx of urine odor, frequency with good flow, vaginal ache, low back ache; no hematuria/pelvic pain, fevers; sx for a few wks. Did [facility] appt and given macrobid BID for 5 days without sx change. Sx have worsened past 1-2 wks. No vag sx, no vag bleeding. Hx of UTI in distant past. Infrequently sex active.  Would like Rx RF motrin 800 for migraines. Has sumatriptan with PCP but doesn't like to take if has to go to work.   Past Medical History:  Diagnosis Date  . Anemia   . Anxiety   . BRCA negative 10/2017   MyRisk neg except VUS in AXIN2, MSH3, RNF43  . Depression   . Family history of breast cancer   . Family history of ovarian cancer   . GERD (gastroesophageal reflux disease)   . Increased risk of breast cancer 10/2017   IBIS=25%  . Migraine   . Septal defect, heart 2016   Dr. Saralyn Pilar septal hypokinesis  . Shortness of breath dyspnea    with activity    Past Surgical History:  Procedure Laterality Date  . BREAST BIOPSY Right 03-25-14   Stromal fibrosis and benign calcifications  . BREAST BIOPSY Left 02/28/2015   Procedure: BREAST BIOPSY WITH NEEDLE LOCALIZATION;  Surgeon: Robert Bellow, MD;  Location: [facility] ORS;  Service: General;  Laterality: Left;  . BREAST BIOPSY Right 03/29/2018   affirm bx right x clip   path pending  . BREAST EXCISIONAL BIOPSY Left 2016   benign  . ENDOMETRIAL ABLATION    . TUBAL LIGATION  2007    Family History  Problem Relation Age of Onset  . Breast cancer Mother 8  . Hypertension Father   . Ovarian cancer Paternal Grandmother     Social History   Socioeconomic History  . Marital status: Single    Spouse name: Not on file  .  Number of children: Not on file  . Years of education: Not on file  . Highest education level: Not on file  Occupational History  . Not on file  Tobacco Use  . Smoking status: Current Every Day Smoker    Packs/day: 1.00    Years: 25.00    Pack years: 25.00    Types: Cigarettes  . Smokeless tobacco: Never Used  Vaping Use  . Vaping Use: Never used  Substance and Sexual Activity  . Alcohol use: No  . Drug use: No  . Sexual activity: Not Currently    Birth control/protection: Surgical  Other Topics Concern  . Not on file  Social History Narrative  . Not on file   Social Determinants of Health   Financial Resource Strain: Not on file  Food Insecurity: Not on file  Transportation Needs: Not on file  Physical Activity: Not on file  Stress: Not on file  Social Connections: Not on file  Intimate Partner Violence: Not on file    Outpatient Medications Prior to Visit  Medication Sig Dispense Refill  . citalopram (CELEXA) 40 MG tablet TAKE 1 TABLET BY MOUTH AT BEDTIME 30 tablet 5  . SUMAtriptan (IMITREX) 100 MG tablet Take 1 tablet (100 mg total)  by mouth every 2 (two) hours as needed for migraine. May repeat in 2 hours if headache persists or recurs. 10 tablet 0  . ibuprofen (ADVIL,MOTRIN) 800 MG tablet Take 800 mg by mouth every 8 (eight) hours as needed for headache or mild pain.      No facility-administered medications prior to visit.      ROS:  Review of Systems  Constitutional: Negative for fever.  Gastrointestinal: Negative for blood in stool, constipation, diarrhea, nausea and vomiting.  Genitourinary: Positive for dysuria, frequency and urgency. Negative for dyspareunia, flank pain, hematuria, vaginal bleeding, vaginal discharge and vaginal pain.  Musculoskeletal: Negative for back pain.  Skin: Negative for rash.    OBJECTIVE:   Vitals:  BP 90/60   Ht 5' 7"  (1.702 m)   Wt 135 lb (61.2 kg)   BMI 21.14 kg/m   Physical Exam Vitals reviewed.   Constitutional:      Appearance: She is well-developed. She is not ill-appearing or toxic-appearing.  Pulmonary:     Effort: Pulmonary effort is normal.  Abdominal:     Tenderness: There is no right CVA tenderness or left CVA tenderness.  Musculoskeletal:        General: Normal range of motion.     Cervical back: Normal range of motion.  Neurological:     General: No focal deficit present.     Mental Status: She is alert and oriented to person, place, and time.     Cranial Nerves: No cranial nerve deficit.  Psychiatric:        Behavior: Behavior normal.        Thought Content: Thought content normal.        Judgment: Judgment normal.     Results: Results for orders placed or performed in visit on 10/29/20 (from the past 24 hour(s))  POCT Urinalysis Dipstick     Status: Abnormal   Collection Time: 10/29/20 10:21 AM  Result Value Ref Range   Color, UA yellow    Clarity, UA cloudy    Glucose, UA Negative Negative   Bilirubin, UA neg    Ketones, UA neg    Spec Grav, UA 1.020 1.010 - 1.025   Blood, UA small    pH, UA 6.0 5.0 - 8.0   Protein, UA Positive (A) Negative   Urobilinogen, UA     Nitrite, UA neg    Leukocytes, UA Moderate (2+) (A) Negative   Appearance     Odor strong      Assessment/Plan: Acute cystitis with hematuria - Plan: sulfamethoxazole-trimethoprim (BACTRIM DS) 800-160 MG tablet, POCT Urinalysis Dipstick, Urine Culture; pos sx and UA. No relief with macrobid. Rx bactrim, check C&S. Will f/u if need to change abx. F/u prn.   Chronic migraine without aura without status migrainosus, not intractable - Plan: ibuprofen (ADVIL) 800 MG tablet; Rx RF prn pt request   Meds ordered this encounter  Medications  . sulfamethoxazole-trimethoprim (BACTRIM DS) 800-160 MG tablet    Sig: Take 1 tablet by mouth 2 (two) times daily for 7 days.    Dispense:  14 tablet    Refill:  0    Order Specific Question:   Supervising Provider    Answer:   Gae Dry  [id number]  . ibuprofen (ADVIL) 800 MG tablet    Sig: Take 1 tablet (800 mg total) by mouth every 8 (eight) hours as needed.    Dispense:  30 tablet    Refill:  1    Order Specific Question:  Supervising Provider    Answer:   Gae Dry [[id number]]      Return if symptoms worsen or fail to improve.  Nyasia Baxley B. Genene Kilman, PA-C 10/29/2020 10:27 AM

## 2020-10-29 ENCOUNTER — Other Ambulatory Visit: Payer: Self-pay

## 2020-10-29 ENCOUNTER — Encounter: Payer: Self-pay | Admitting: Obstetrics and Gynecology

## 2020-10-29 ENCOUNTER — Ambulatory Visit (INDEPENDENT_AMBULATORY_CARE_PROVIDER_SITE_OTHER): Payer: BC Managed Care – PPO | Admitting: Obstetrics and Gynecology

## 2020-10-29 VITALS — BP 90/60 | Ht 67.0 in | Wt 135.0 lb

## 2020-10-29 DIAGNOSIS — G43709 Chronic migraine without aura, not intractable, without status migrainosus: Secondary | ICD-10-CM

## 2020-10-29 DIAGNOSIS — N3001 Acute cystitis with hematuria: Secondary | ICD-10-CM | POA: Diagnosis not present

## 2020-10-29 LAB — POCT URINALYSIS DIPSTICK
Bilirubin, UA: NEGATIVE
Glucose, UA: NEGATIVE
Ketones, UA: NEGATIVE
Nitrite, UA: NEGATIVE
Protein, UA: POSITIVE — AB
Spec Grav, UA: 1.02 (ref 1.010–1.025)
pH, UA: 6 (ref 5.0–8.0)

## 2020-10-29 MED ORDER — IBUPROFEN 800 MG PO TABS: 800.0000 mg | ORAL_TABLET | Freq: Three times a day (TID) | ORAL | 1 refills | Status: DC | PRN

## 2020-10-29 MED ORDER — SULFAMETHOXAZOLE-TRIMETHOPRIM 800-160 MG PO TABS: 1.0000 | ORAL_TABLET | Freq: Two times a day (BID) | ORAL | 0 refills | Status: AC

## 2020-10-29 NOTE — Patient Instructions (Signed)
I value your feedback and you entrusting [place] with your care. If you get a [facility] patient survey, I would appreciate you taking the time to let us know about your experience today. Thank you!

## 2020-11-01 LAB — URINE CULTURE

## 2020-11-02 ENCOUNTER — Encounter: Payer: Self-pay | Admitting: Obstetrics and Gynecology

## 2020-11-03 NOTE — Telephone Encounter (Signed)
LMTRC

## 2020-11-04 ENCOUNTER — Telehealth: Payer: Self-pay

## 2020-11-04 NOTE — Telephone Encounter (Signed)
Pt returning your call. CB#[phone]

## 2020-11-04 NOTE — Telephone Encounter (Signed)
LMTRC

## 2020-12-15 ENCOUNTER — Ambulatory Visit: Payer: BC Managed Care – PPO | Admitting: Obstetrics & Gynecology

## 2021-01-15 ENCOUNTER — Ambulatory Visit: Payer: BC Managed Care – PPO | Admitting: Obstetrics & Gynecology

## 2021-03-19 ENCOUNTER — Ambulatory Visit: Payer: BC Managed Care – PPO | Admitting: Obstetrics & Gynecology

## 2021-04-24 ENCOUNTER — Other Ambulatory Visit (HOSPITAL_COMMUNITY)
Admission: RE | Admit: 2021-04-24 | Discharge: 2021-04-24 | Disposition: A | Discharge status: Home / Self Care | Payer: BC Managed Care – PPO | Source: Ambulatory Visit | Attending: Obstetrics & Gynecology | Admitting: Obstetrics & Gynecology

## 2021-04-24 ENCOUNTER — Ambulatory Visit (INDEPENDENT_AMBULATORY_CARE_PROVIDER_SITE_OTHER): Payer: BC Managed Care – PPO | Admitting: Obstetrics & Gynecology

## 2021-04-24 ENCOUNTER — Encounter: Payer: Self-pay | Admitting: Obstetrics & Gynecology

## 2021-04-24 ENCOUNTER — Other Ambulatory Visit: Payer: Self-pay

## 2021-04-24 VITALS — BP 100/60 | Ht 67.0 in | Wt 132.0 lb

## 2021-04-24 DIAGNOSIS — Z01419 Encounter for gynecological examination (general) (routine) without abnormal findings: Secondary | ICD-10-CM | POA: Diagnosis not present

## 2021-04-24 DIAGNOSIS — Z124 Encounter for screening for malignant neoplasm of cervix: Secondary | ICD-10-CM | POA: Diagnosis present

## 2021-04-24 DIAGNOSIS — Z1239 Encounter for other screening for malignant neoplasm of breast: Secondary | ICD-10-CM | POA: Diagnosis not present

## 2021-04-24 DIAGNOSIS — Z1211 Encounter for screening for malignant neoplasm of colon: Secondary | ICD-10-CM

## 2021-04-24 DIAGNOSIS — Z780 Asymptomatic menopausal state: Secondary | ICD-10-CM

## 2021-04-24 DIAGNOSIS — Z803 Family history of malignant neoplasm of breast: Secondary | ICD-10-CM

## 2021-04-24 MED ORDER — COMBIPATCH 0.05-0.25 MG/DAY TD PTTW: 1.0000 | MEDICATED_PATCH | TRANSDERMAL | 11 refills | Status: DC

## 2021-04-24 NOTE — Patient Instructions (Signed)
PAP every three years Mammogram every year    Call [phone] to schedule at [facility] Colonoscopy every 10 years Labs yearly (with PCP)  Thank you for choosing [facility]. As part of our ongoing efforts to improve patient experience, we would appreciate your feedback. Please fill out the short survey that you will receive by mail or MyChart. Your opinion is important to [place]! - Dr. Kenton Kingfisher  Estradiol; Norethindrone skin patches What is this medication? ESTRADIOL; NORETHINDRONE (es tra DYE ole; nor eth IN drone) contains a mixture of female hormones. This medicine helps to relieve the symptoms of menopause like hot flashes, night sweats, mood changes, and vaginal dryness and irritation. It is also used to treat women with low estrogen levels or those who have had their ovaries removed. This medicine may be used for other purposes; ask your health care provider or pharmacist if you have questions. COMMON BRAND NAME(S): CombiPatch What should I tell my care team before I take this medication? They need to know if you have any of these conditions: blood vessel disease or blood clots breast, cervical, endometrial, or uterine cancer diabetes endometriosis fibroids gallbladder disease heart disease or recent heart attack high blood cholesterol high blood pressure high level of calcium in the blood hysterectomy kidney disease liver disease mental depression migraine headaches porphyria stroke systemic lupus erythematosus (SLE) tobacco smoker vaginal bleeding an unusual or allergic reaction to estrogens, progestins, other medicines, foods, dyes, or preservatives pregnant or trying to get pregnant breast-feeding How should I use this medication? This medicine is for external use only. Follow the directions on the prescription label. Use exactly as directed. Tear open the pouch, do not use scissors. Remove the stiff protective liner covering the adhesive. Try not to touch the  adhesive. Apply the patch, sticky side to the skin, to an area of the lower abdomen that is clean, dry and hairless. Avoid injured, irritated, calloused, or scarred areas. Do not apply the skin patches to your breasts or around the waist area. Use a different site each time to prevent skin irritation. You should change your patch on the same days each week. Do not cut or trim the patch. Do not stop using except on the advice of your doctor or health care professional. Talk to your pediatrician regarding the use of this medicine in children. Special care may be needed. A patient package insert for the product will be given with each prescription and refill. Read this sheet carefully each time. The sheet may change frequently. Overdosage: If you think you have taken too much of this medicine contact a poison control center or emergency room at once. NOTE: This medicine is only for you. Do not share this medicine with others. What if I miss a dose? If you forget to change your patch as scheduled, apply it as soon as possible. Remember to remove the old patch. If it is almost time to apply the next patch, skip the missed patch and get back on your normal schedule. Do not wear more than one patch at a time unless you are told to do so by your doctor or health care professional. What may interact with this medication? Do not take this medicine with any of the following medications: aromatase inhibitors like aminoglutethimide, anastrozole, exemestane, letrozole, testolactone This medicine may also interact with the following medications: barbiturates, such as phenobarbital benzodiazepines bosentan bromocriptine carbamazepine cimetidine cyclosporine dantrolene grapefruit juice griseofulvin hydrocortisone, cortisone, or prednisolone isoniazid (INH) medications for diabetes methotrexate mineral oil phenytoin  raloxifene rifabutin, rifampin, or rifapentine tamoxifen thyroid  hormones topiramate tricyclic antidepressants warfarin This list may not describe all possible interactions. Give your health care provider a list of all the medicines, herbs, non-prescription drugs, or dietary supplements you use. Also tell them if you smoke, drink alcohol, or use illegal drugs. Some items may interact with your medicine. What should I watch for while using this medication? Visit your health care professional for regular checks on your progress. You should have a complete check-up every 6 months. You will need a regular breast and pelvic exam. You should also discuss the need for regular mammograms with your health care professional, and follow his or her guidelines. This medicine can make your body retain fluid, making your fingers, hands, or ankles swell. Your blood pressure can go up. Contact your doctor or health care professional if you feel you are retaining fluid. If you have any reason to think you are pregnant; stop taking this medicine at once and contact your doctor or health care professional. Tobacco smoking increases the risk of getting a blood clot or having a stroke, especially if you are more than 48 years old. You are strongly advised not to smoke. If you wear contact lenses and notice visual changes, or if the lenses begin to feel uncomfortable, consult your eye care specialist. If you are going to have elective surgery, you may need to stop taking this medicine beforehand. Consult your health care professional for advice prior to scheduling the surgery. If you are going to have a MRI procedure, let your MRI technician know about the use of these patches. Some drug patches contain an aluminized backing that can become heated when exposed to MRI and may cause burns. You may need to temporarily remove the patch during the MRI procedure. You may bathe or participate in other activities while wearing your patch. If your patch falls off reapply it. If you cannot reapply  the patch, apply a new patch to another area and continue to follow your usual dose schedule. What side effects may I notice from receiving this medication? Side effects that you should report to your doctor or health care professional as soon as possible: allergic reactions like skin rash, itching or hives, swelling of the face, lips, or tongue breast tissue changes or discharge changes in vision chest pain confusion, trouble speaking or understanding dark urine general ill feeling or flu-like symptoms light-colored stools nausea, vomiting pain, swelling, warmth in the leg right upper belly pain severe headaches shortness of breath sudden numbness or weakness of the face, arm or leg trouble walking, dizziness, loss of balance or coordination unusual vaginal bleeding yellowing of the eyes or skin Side effects that usually do not require medical attention (report to your doctor or health care professional if they continue or are bothersome): acne brown spots on the face change in appetite change in sexual desire depressed mood or mood swings fluid retention and swelling stomach cramps or bloating unusually weak or tired weight gain This list may not describe all possible side effects. Call your doctor for medical advice about side effects. You may report side effects to FDA at 1-800-FDA-1088. Where should I keep my medication? Keep out of the reach of children. Store at room temperature between 15 and 30 degrees C (59 and 86 degrees F) in the sealed foil pouch. Throw away any unused medicine after 6 months or the expiration date on the package, whichever is sooner. NOTE: This sheet is a summary. It  may not cover all possible information. If you have questions about this medicine, talk to your doctor, pharmacist, or health care provider.  2022 Elsevier/Gold Standard (2016-02-17 12:55:43)

## 2021-04-24 NOTE — Progress Notes (Signed)
HPI:      [name] is a 50 y.o. [id number] who LMP was in the past, she presents today for her annual examination.  The patient has no complaints today other than new onset menopause sx's, with no period for now >1 year; she has hot flashes, insomnia, vaginal dryness, pain w intercourse, decr libido, and anxiety.   Herlast pap: approximate date 2019 and was normal and last mammogram: approximate date 2021 and was normal.  She has not had breast MRI.  SHe is BRCA neg, with TC ratio in past 25%.  The patient does perform self breast exams.  There is notable family history of breast or ovarian cancer in her family. The patient is not taking hormone replacement therapy. Patient denies post-menopausal vaginal bleeding.   The patient has regular exercise: yes. The patient denies current symptoms of depression.    GYN Hx: Last Colonoscopy: not yet done   PMHx: Past Medical History:  Diagnosis Date   Anemia    Anxiety    BRCA negative 10/2017   MyRisk neg except VUS in AXIN2, MSH3, RNF43   Depression    Family history of breast cancer    Family history of ovarian cancer    GERD (gastroesophageal reflux disease)    Increased risk of breast cancer 10/2017   IBIS=25%   Migraine    Septal defect, heart 2016   Dr. Saralyn Nelson septal hypokinesis   Shortness of breath dyspnea    with activity   Past Surgical History:  Procedure Laterality Date   BREAST BIOPSY Right 03-25-14   Stromal fibrosis and benign calcifications   BREAST BIOPSY Left 02/28/2015   Procedure: BREAST BIOPSY WITH NEEDLE LOCALIZATION;  Surgeon:  Bellow, MD;  Location: [facility] ORS;  Service: General;  Laterality: Left;   BREAST BIOPSY Right 03/29/2018   affirm bx right x clip   path pending   BREAST EXCISIONAL BIOPSY Left 2016   benign   ENDOMETRIAL ABLATION     TUBAL LIGATION  2007   Family History  Problem Relation Age of Onset   Breast cancer Mother 10   Hypertension Father    Ovarian cancer Paternal  Grandmother    Social History   Tobacco Use   Smoking status: Every Day    Packs/day: 1.00    Years: 25.00    Pack years: 25.00    Types: Cigarettes   Smokeless tobacco: Never  Vaping Use   Vaping Use: Never used  Substance Use Topics   Alcohol use: No   Drug use: No    Current Outpatient Medications:    busPIRone (BUSPAR) 5 MG tablet, Take 5 mg by mouth 2 (two) times daily., Disp: , Rfl:    [START ON 04/27/2021] estradiol-norethindrone (COMBIPATCH) 0.05-0.25 MG/DAY, Place 1 patch onto the skin 2 (two) times a week., Disp: 12 patch, Rfl: 11   venlafaxine XR (EFFEXOR-XR) 75 MG 24 hr capsule, Take by mouth 2 (two) times daily., Disp: , Rfl:    citalopram (CELEXA) 40 MG tablet, TAKE 1 TABLET BY MOUTH AT BEDTIME (Patient not taking: Reported on 04/24/2021), Disp: 30 tablet, Rfl: 5   ibuprofen (ADVIL) 800 MG tablet, Take 1 tablet (800 mg total) by mouth every 8 (eight) hours as needed. (Patient not taking: Reported on 04/24/2021), Disp: 30 tablet, Rfl: 1   SUMAtriptan (IMITREX) 100 MG tablet, Take 1 tablet (100 mg total) by mouth every 2 (two) hours as needed for migraine. May repeat in 2 hours if headache persists  or recurs. (Patient not taking: Reported on 04/24/2021), Disp: 10 tablet, Rfl: 0 Allergies: Patient has no known allergies.  Review of Systems  Constitutional:  Positive for malaise/fatigue. Negative for chills and fever.  HENT:  Negative for congestion, sinus pain and sore throat.   Eyes:  Negative for blurred vision and pain.  Respiratory:  Negative for cough and wheezing.   Cardiovascular:  Negative for chest pain and leg swelling.  Gastrointestinal:  Negative for abdominal pain, constipation, diarrhea, heartburn, nausea and vomiting.  Genitourinary:  Negative for dysuria, frequency, hematuria and urgency.  Musculoskeletal:  Negative for back pain, joint pain, myalgias and neck pain.  Skin:  Negative for itching and rash.  Neurological:  Negative for dizziness, tremors and  weakness.  Endo/Heme/Allergies:  Does not bruise/bleed easily.  Psychiatric/Behavioral:  Negative for depression. The patient is not nervous/anxious and does not have insomnia.    Objective: BP 100/60   Ht _0  (1.702 m)   Wt 132 lb (59.9 kg)   BMI 20.67 kg/m   Filed Weights   04/24/21 0956  Weight: 132 lb (59.9 kg)   Body mass index is 20.67 kg/m. Physical Exam Constitutional:      General: She is not in acute distress.    Appearance: She is well-developed.  Genitourinary:     Bladder, rectum and urethral meatus normal.     No lesions in the vagina.     Right Labia: No rash, tenderness or lesions.    Left Labia: No tenderness, lesions or rash.    No vaginal bleeding.      Right Adnexa: not tender and no mass present.    Left Adnexa: not tender and no mass present.    No cervical motion tenderness, friability, lesion or polyp.     Uterus is not enlarged.     No uterine mass detected.    Pelvic exam was performed with patient in the lithotomy position.  Breasts:    Right: No mass, skin change or tenderness.     Left: No mass, skin change or tenderness.  HENT:     Head: Normocephalic and atraumatic. No laceration.     Right Ear: Hearing normal.     Left Ear: Hearing normal.     Mouth/Throat:     Pharynx: Uvula midline.  Eyes:     Pupils: Pupils are equal, round, and reactive to light.  Neck:     Thyroid: No thyromegaly.  Cardiovascular:     Rate and Rhythm: Normal rate and regular rhythm.     Heart sounds: No murmur heard.   No friction rub. No gallop.  Pulmonary:     Effort: Pulmonary effort is normal. No respiratory distress.     Breath sounds: Normal breath sounds. No wheezing.  Abdominal:     General: Bowel sounds are normal. There is no distension.     Palpations: Abdomen is soft.     Tenderness: There is no abdominal tenderness. There is no rebound.  Musculoskeletal:        General: Normal range of motion.     Cervical back: Normal range of motion and  neck supple.  Neurological:     Mental Status: She is alert and oriented to person, place, and time.     Cranial Nerves: No cranial nerve deficit.  Skin:    General: Skin is warm and dry.  Psychiatric:        Judgment: Judgment normal.  Vitals reviewed.    Assessment: Annual Exam 1.  Women's annual routine gynecological examination   2. Screening breast examination   3. Screening for cervical cancer   4. Encounter for screening colonoscopy   5. Menopause   6. Family history of breast cancer     Plan:            1.  Cervical Screening-  Pap smear done today  2. Breast screening- Exam annually and mammogram scheduled MRI also discussed, and would be scheduled in 6 mos after MMG  3. Colonoscopy every 10 years, Hemoccult testing after age 21  4. Labs managed by PCP  5. Counseling for hormonal therapy: wants to start HRT  due to hot flashes, insomnia, no energy, and vaginal dryness DIscussed pros and cons Rx, Combipatch; also discussed pill options As she has vag dryness and dyspareunia, I believe HRT may help her more than non-hormonal options.  These options are discussed as well today.  HRT I have discussed HRT with the patient in detail.  The risk/benefits of it were reviewed.  She understands that during menopause Estrogen decreases dramatically and that this results in an increased risk of cardiovascular disease as well as osteoporosis.  We have also discussed the fact that hot flashes often result from a decrease in Estrogen, and that by replacing Estrogen, they can often be alleviated.  We have discussed skin, vaginal and urinary tract changes that may also take place from this drop in Estrogen.  Emotional changes have also been linked to Estrogen and we have briefly discussed this.  The benefits of HRT including decrease in hot flashes, vaginal dryness, and osteoporosis were discussed.  The emotional benefit and a possible change in her cardiovascular risk profile was also  reviewed.  The risks associated with Hormone Replacement Therapy were also reviewed.  The use of unopposed Estrogen and its relationship to endometrial cancer was discussed.  The addition of Progesterone and its beneficial effect on endometrial cancer was also noted.  The fact that there has been no consistent definitive studies showing an increase in breast cancer in women who use HRT was discussed with the patient.  The possible side effects including breast tenderness, fluid retention, mood changes and vaginal bleeding were discussed.  The patient was informed that this is an elective medication and that she may choose not to take Hormone Replacement Therapy.  Literature on HRT was given, and I believe that after answering all of the patient's questions, she has an adequate and informed understanding of HRT.  Special emphasis on the WHI study, as well as several studies since that pertaining to the risks and benefits of estrogen replacement therapy were compared.  The possible limitations of these studies were discussed including the age stratification of the WHI study.  The possible role of Progesterone in these studies was discussed in detail.  I believe that the patient has an informed knowledge of the risks and benefits of HRT.  I have specifically discussed WHI findings and current updates.  Different type of hormone formulation and methods of taking hormone replacement therapy discussed.     F/U  Return in about 2 months (around 06/24/2021) for Follow up, Virtual.  Barnett Applebaum, MD, [facility], [facility] 04/24/2021  10:59 AM

## 2021-04-29 LAB — CYTOLOGY - PAP
Chlamydia: NEGATIVE
Comment: NEGATIVE
Comment: NEGATIVE
Comment: NEGATIVE
Comment: NORMAL
Diagnosis: NEGATIVE
Diagnosis: REACTIVE
High risk HPV: NEGATIVE
Neisseria Gonorrhea: NEGATIVE
Trichomonas: NEGATIVE

## 2021-05-21 ENCOUNTER — Other Ambulatory Visit: Payer: Self-pay | Admitting: Obstetrics & Gynecology

## 2021-05-21 DIAGNOSIS — Z780 Asymptomatic menopausal state: Secondary | ICD-10-CM

## 2021-05-21 MED ORDER — COMBIPATCH 0.05-0.25 MG/DAY TD PTTW: 1.0000 | MEDICATED_PATCH | TRANSDERMAL | 3 refills | Status: AC

## 2021-05-23 LAB — COLOGUARD: Cologuard: NEGATIVE

## 2021-05-28 ENCOUNTER — Other Ambulatory Visit: Payer: Self-pay

## 2021-05-28 ENCOUNTER — Ambulatory Visit
Admission: RE | Admit: 2021-05-28 | Discharge: 2021-05-28 | Disposition: A | Discharge status: Home / Self Care | Payer: BC Managed Care – PPO | Source: Ambulatory Visit | Attending: Obstetrics & Gynecology | Admitting: Obstetrics & Gynecology

## 2021-05-28 DIAGNOSIS — Z1231 Encounter for screening mammogram for malignant neoplasm of breast: Secondary | ICD-10-CM | POA: Diagnosis present

## 2021-05-28 DIAGNOSIS — Z1239 Encounter for other screening for malignant neoplasm of breast: Secondary | ICD-10-CM

## 2021-06-02 ENCOUNTER — Other Ambulatory Visit: Payer: Self-pay | Admitting: Obstetrics & Gynecology

## 2021-06-02 DIAGNOSIS — N6489 Other specified disorders of breast: Secondary | ICD-10-CM

## 2021-06-02 DIAGNOSIS — R928 Other abnormal and inconclusive findings on diagnostic imaging of breast: Secondary | ICD-10-CM

## 2021-06-04 ENCOUNTER — Telehealth: Payer: Self-pay | Admitting: *Deleted

## 2021-06-04 NOTE — Telephone Encounter (Signed)
CALLED PT TO [facility] AV / MAMMO - I CALLED THE # IN THE SYSTEM, BUT GOT AN AUTOMATED MESSAGE THAT CALLS COULD NOT BE COMPLETED AT THIS TIME - UPDATED LETTER W/ # TO CALL TO [facility]

## 2021-06-22 ENCOUNTER — Other Ambulatory Visit: Payer: BC Managed Care – PPO

## 2021-06-22 ENCOUNTER — Ambulatory Visit: Admission: RE | Admit: 2021-06-22 | Payer: BC Managed Care – PPO | Source: Ambulatory Visit

## 2021-06-25 ENCOUNTER — Telehealth: Payer: BC Managed Care – PPO | Admitting: Obstetrics & Gynecology

## 2021-07-30 ENCOUNTER — Other Ambulatory Visit: Payer: BC Managed Care – PPO

## 2021-07-30 ENCOUNTER — Inpatient Hospital Stay: Admission: RE | Admit: 2021-07-30 | Payer: BC Managed Care – PPO | Source: Ambulatory Visit

## 2021-09-17 ENCOUNTER — Other Ambulatory Visit: Payer: Self-pay

## 2021-09-17 ENCOUNTER — Ambulatory Visit
Admission: RE | Admit: 2021-09-17 | Discharge: 2021-09-17 | Disposition: A | Discharge status: Home / Self Care | Payer: BC Managed Care – PPO | Source: Ambulatory Visit | Attending: Obstetrics & Gynecology | Admitting: Obstetrics & Gynecology

## 2021-09-17 DIAGNOSIS — R928 Other abnormal and inconclusive findings on diagnostic imaging of breast: Secondary | ICD-10-CM | POA: Diagnosis present

## 2021-09-17 DIAGNOSIS — N6489 Other specified disorders of breast: Secondary | ICD-10-CM

## 2021-09-25 ENCOUNTER — Encounter: Payer: Self-pay | Admitting: Emergency Medicine

## 2021-09-25 ENCOUNTER — Other Ambulatory Visit: Payer: Self-pay

## 2021-09-25 ENCOUNTER — Emergency Department
Admission: EM | Admit: 2021-09-25 | Discharge: 2021-09-25 | Disposition: A | Discharge status: Home / Self Care | Payer: BC Managed Care – PPO | Attending: Student in an Organized Health Care Education/Training Program | Admitting: Student in an Organized Health Care Education/Training Program

## 2021-09-25 DIAGNOSIS — F32A Depression, unspecified: Secondary | ICD-10-CM | POA: Diagnosis not present

## 2021-09-25 DIAGNOSIS — R4584 Anhedonia: Secondary | ICD-10-CM | POA: Insufficient documentation

## 2021-09-25 DIAGNOSIS — F29 Unspecified psychosis not due to a substance or known physiological condition: Secondary | ICD-10-CM | POA: Diagnosis not present

## 2021-09-25 LAB — CBC
HCT: 40 % (ref 36.0–46.0)
Hemoglobin: 13.4 g/dL (ref 12.0–15.0)
MCH: 30.5 pg (ref 26.0–34.0)
MCHC: 33.5 g/dL (ref 30.0–36.0)
MCV: 91.1 fL (ref 80.0–100.0)
Platelets: 211 10*3/uL (ref 150–400)
RBC: 4.39 MIL/uL (ref 3.87–5.11)
RDW: 13.3 % (ref 11.5–15.5)
WBC: 8.1 10*3/uL (ref 4.0–10.5)
nRBC: 0 % (ref 0.0–0.2)

## 2021-09-25 LAB — COMPREHENSIVE METABOLIC PANEL
ALT: 16 U/L (ref 0–44)
AST: 20 U/L (ref 15–41)
Albumin: 4.3 g/dL (ref 3.5–5.0)
Alkaline Phosphatase: 90 U/L (ref 38–126)
Anion gap: 8 (ref 5–15)
BUN: 16 mg/dL (ref 6–20)
CO2: 27 mmol/L (ref 22–32)
Calcium: 9.4 mg/dL (ref 8.9–10.3)
Chloride: 101 mmol/L (ref 98–111)
Creatinine, Ser: 0.66 mg/dL (ref 0.44–1.00)
GFR, Estimated: >60 mL/min (ref >60)
Glucose, Bld: 90 mg/dL (ref 70–99)
Potassium: 3.5 mmol/L (ref 3.5–5.1)
Sodium: 136 mmol/L (ref 135–145)
Total Bilirubin: 0.6 mg/dL (ref 0.3–1.2)
Total Protein: 7.7 g/dL (ref 6.5–8.1)

## 2021-09-25 LAB — ETHANOL: Alcohol, Ethyl (B): <10 mg/dL (ref <10)

## 2021-09-25 LAB — ACETAMINOPHEN LEVEL: Acetaminophen (Tylenol), Serum: <10 ug/mL — ABNORMAL LOW (ref 10–30)

## 2021-09-25 LAB — SALICYLATE LEVEL: Salicylate Lvl: <7 mg/dL — ABNORMAL LOW (ref 7.0–30.0)

## 2021-09-25 NOTE — ED Notes (Addendum)
Patient bought over to [facility], reports that she didn't expect that she would be bought to a "psychiatric prison" and expresses wanting to leave. This RN stated she might be willing to wait to see psychiatry based on how long that would take, this RN said she would reach out to psychiatry but that I wasn't able to tell her how long this would take. MD Quentin Cornwall and MD Clapacs also notified.

## 2021-09-25 NOTE — ED Notes (Signed)
Discussed with MD Quentin Cornwall patient expressing wanting to leave again and not wanting to stay longer. Patient offered ativan/ moving back to the quad and patient stated she feels like she would be better off at home. MD Quentin Cornwall contacted again and approved for discharge, patient belongings returned and patient given d/c papers and escorted to lobby for safe patient discharge.

## 2021-09-25 NOTE — BH Assessment (Signed)
Comprehensive Clinical Assessment (CCA) Note  09/25/2021 DAWNN NAM [id number]  Chief Complaint:  Chief Complaint  Patient presents with   Mental Health Problem   Visit Diagnosis: Depression  Anita Nelson. Bloomquist is 51 year old who presents to the ER after she was advised to do so by her PCP because of the increase symptoms of depression. Her PCP completed a referral for outpatient psychiatrist but wanted her to come to the ER to see if she can get something till she see's the outpatient provider. Patient states she has lost the motivation to do her daily activities and responsibilities. She's spending more time in the bed, having crying spells and missing work. She further reports her PCP change her medications approximately four to six months ago. She was taking Celexa and was switched to Effexor because she start having anxiety and panic attacks.      During the interview the patient was calm, cooperative and pleasant. Throughout the interview, she denied SI/HI and AV/H.    CCA Screening, Triage and Referral (STR)  Patient Reported Information How did you hear about [place]? No data recorded What Is the Reason for Your Visit/Call Today? Patient having increase depression.  How Long Has This Been Causing You Problems? No data recorded What Do You Feel Would Help You the Most Today? Treatment for Depression or other mood problem   Have You Recently Had Any Thoughts About Hurting Yourself? No  Are You Planning to Commit Suicide/Harm Yourself At This time? No   Have you Recently Had Thoughts About [place]? No  Are You Planning to Harm Someone at This Time? No  Explanation: No data recorded  Have You Used Any Alcohol or Drugs in the Past 24 Hours? Yes  How Long Ago Did You Use Drugs or Alcohol? No data recorded What Did You Use and How Much? Cananbis & Alcohol   Do You Currently Have a Therapist/Psychiatrist? No  Name of Therapist/Psychiatrist: No data  recorded  Have You Been Recently Discharged From Any Office Practice or Programs? No  Explanation of Discharge From Practice/Program: No data recorded    CCA Screening Triage Referral Assessment Type of Contact: Face-to-Face  Telemedicine Service Delivery:   Is this Initial or Reassessment? No data recorded Date Telepsych consult ordered in [facility]:  No data recorded Time Telepsych consult ordered in CHL:  No data recorded Location of Assessment: [facility] ED  Provider Location: [facility] ED   Collateral Involvement: No data recorded  Does Patient Have a [place]? No data recorded Name and Contact of Legal Guardian: No data recorded If Minor and Not Living with Parent(s), Who has Custody? No data recorded Is CPS involved or ever been involved? Never  Is APS involved or ever been involved? Never   Patient Determined To Be At Risk for Harm To Self or Others Based on Review of Patient Reported Information or Presenting Complaint? No  Method: No data recorded Availability of Means: No data recorded Intent: No data recorded Notification Required: No data recorded Additional Information for Danger to Others Potential: No data recorded Additional Comments for Danger to Others Potential: No data recorded Are There Guns or Other Weapons in Your Home? No data recorded Types of Guns/Weapons: No data recorded Are These Weapons Safely Secured?                            No data recorded Who Could Verify You Are Able  To Have These Secured: No data recorded Do You Have any Outstanding Charges, Pending Court Dates, Parole/Probation? No data recorded Contacted To Inform of Risk of Harm To Self or Others: No data recorded   Does Patient Present under Involuntary Commitment? No  IVC Papers Initial File Date: No data recorded  [place] of Residence: [place]   Patient Currently Receiving the Following Services: Not Receiving Services   Determination of Need: Emergent (2  hours)   Options For Referral: ED Visit     CCA Biopsychosocial Patient Reported Schizophrenia/Schizoaffective Diagnosis in Past: No   Strengths: No data recorded  Mental Health Symptoms Depression:   Difficulty Concentrating; Change in energy/activity; Fatigue; Hopelessness   Duration of Depressive symptoms:  Duration of Depressive Symptoms: Greater than two weeks   Mania:   None   Anxiety:    Restlessness   Psychosis:   None   Duration of Psychotic symptoms:    Trauma:   None   Obsessions:   None   Compulsions:   None   Inattention:   None   Hyperactivity/Impulsivity:   None   Oppositional/Defiant Behaviors:   None   Emotional Irregularity:   None   Other Mood/Personality Symptoms:  No data recorded   Mental Status Exam Appearance and self-care  Stature:   Average   Weight:   Average weight   Clothing:   Neat/clean; Age-appropriate   Grooming:   Normal   Cosmetic use:   None   Posture/gait:   Normal   Motor activity:   -- (Unable to quantify)   Sensorium  Attention:   Normal   Concentration:   Normal   Orientation:   X5   Recall/memory:   Normal   Affect and Mood  Affect:   Depressed; Flat   Mood:   Depressed   Relating  Eye contact:   Normal   Facial expression:   Depressed   Attitude toward examiner:   Cooperative   Thought and Language  Speech flow:  Clear and Coherent   Thought content:   Appropriate to Mood and Circumstances   Preoccupation:   None   Hallucinations:  No data recorded  Organization:  No data recorded  [employer] of Knowledge:   Fair   Intelligence:   Average   Abstraction:   Normal   Judgement:   Fair   Art therapist:   Adequate   Insight:   Fair   Decision Making:   Normal   Social Functioning  Social Maturity:   Isolates   Social Judgement:   Normal   Stress  Stressors:   Family conflict; Other (Comment); Work   Coping  Ability:   Advice worker Deficits:   None   Supports:   Friends/Service system; Family     Religion: Religion/Spirituality Are You A Religious Person?: No  Leisure/Recreation: Leisure / Recreation Do You Have Hobbies?: No  Exercise/Diet: Exercise/Diet Do You Exercise?: No Have You Gained or Lost A Significant Amount of Weight in the Past Six Months?: No Do You Follow a Special Diet?: No Do You Have Any Trouble Sleeping?: No   CCA Employment/Education Employment/Work Situation: Employment / Work Situation Employment Situation: Employed Work Stressors: Don't like her job Patient's Job has Been Impacted by Current Illness: Yes Describe how Patient's Job has Been Impacted: Haven't gone to work in a week. Has Patient ever Been in the [place]?: No  Education: Education Is Patient Currently Attending School?: No Did You Have An Individualized Education  Program (IIEP): No Did You Have Any Difficulty At School?: No Patient's Education Has Been Impacted by Current Illness: No   CCA Family/Childhood History Family and Relationship History: Family history Marital status: Single Does patient have children?: Yes How many children?: 2 How is patient's relationship with their children?: It's okay  Childhood History:  Childhood History By whom was/is the patient raised?: Mother Did patient suffer any verbal/emotional/physical/sexual abuse as a child?: No Did patient suffer from severe childhood neglect?: No Has patient ever been sexually abused/assaulted/raped as an adolescent or adult?: No Was the patient ever a victim of a crime or a disaster?: No Witnessed domestic violence?: No Has patient been affected by domestic violence as an adult?: No  Child/Adolescent Assessment:     CCA Substance Use Alcohol/Drug Use: Alcohol / Drug Use Pain Medications: See PTA Prescriptions: See PTA Over the Counter: See PTA History of alcohol / drug use?: Yes Longest period  of sobriety (when/how long): Unable to quantify Substance #1 Name of Substance 1: Alcohol Substance #2 Name of Substance 2: Cannabis   ASAM's:  Six Dimensions of Multidimensional Assessment  Dimension 1:  Acute Intoxication and/or Withdrawal Potential:      Dimension 2:  Biomedical Conditions and Complications:      Dimension 3:  Emotional, Behavioral, or Cognitive Conditions and Complications:     Dimension 4:  Readiness to Change:     Dimension 5:  Relapse, Continued use, or Continued Problem Potential:     Dimension 6:  Recovery/Living Environment:     ASAM Severity Score:    ASAM Recommended Level of Treatment:     Substance use Disorder (SUD)    Recommendations for Services/Supports/Treatments:    Discharge Disposition:    DSM5 Diagnoses: Patient Active Problem List   Diagnosis Date Noted   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

## 2021-09-25 NOTE — ED Triage Notes (Addendum)
Patietn states "I have had depression for years, but this feels worse."  States build up has been happening for a while.  Denies SI/ HI.  States "I would like to sleep for a very long time.  I am exhausted".  States "I feel detatched, and that is scary for me".  AAOx3.  Tearful in triage.  Cooperative.

## 2021-09-25 NOTE — ED Notes (Signed)
Pt given dinner tray.

## 2021-09-25 NOTE — ED Provider Notes (Addendum)
[facility] Provider Note    Event Date/Time   First MD Initiated Contact with Patient 09/25/21 1555     (approximate)   History   Mental Health Problem   HPI  Anita Nelson is a 51 y.o. female with a history of depression and anxiety presents to the ER due to worsening symptoms of depression and anhedonia.  States that she has thoughts of just wishing she was no longer here.  States that she no longer has desire or motivation to get up and go about her day and the symptoms have been ongoing for several days to weeks.  No recent medication changes.  She is never required hospitalization for mental health disorder in the past.  She denies any SI or HI.  No hallucinations.     Physical Exam   Triage Vital Signs: ED Triage Vitals [09/25/21 1536]  Enc Vitals Group     BP 133/84     Pulse Rate 95     Resp 16     Temp 98.5 F (36.9 C)     Temp Source Oral     SpO2 97 %     Weight 132 lb 0.9 oz (59.9 kg)     Height 5' 7"  (1.702 m)     Head Circumference      Peak Flow      Pain Score 0     Pain Loc      Pain Edu?      Excl. in [place]?     Most recent vital signs: Vitals:   09/25/21 1536  BP: 133/84  Pulse: 95  Resp: 16  Temp: 98.5 F (36.9 C)  SpO2: 97%     Constitutional: Alert  Eyes: Conjunctivae are normal.  Head: Atraumatic. Nose: No congestion/rhinnorhea. Mouth/Throat: Mucous membranes are moist.   Neck: Painless ROM.  Cardiovascular:   Good peripheral circulation. Respiratory: Normal respiratory effort.  No retractions.  Gastrointestinal: Soft and nontender.  Musculoskeletal:  no deformity Neurologic:  MAE spontaneously. No gross focal neurologic deficits are appreciated.  Skin:  Skin is warm, dry and intact. No rash noted. Psychiatric: Mood and affect are depressed and withdrawn. Speech and behavior are normal.    ED Results / Procedures / Treatments   Labs (all labs ordered are listed, but only abnormal results are  displayed) Labs Reviewed  CBC  COMPREHENSIVE METABOLIC PANEL  ETHANOL  SALICYLATE LEVEL  ACETAMINOPHEN LEVEL  URINE DRUG SCREEN, QUALITATIVE ([place])  POC URINE PREG, ED     EKG     RADIOLOGY    PROCEDURES:  Critical Care performed:   Procedures   MEDICATIONS ORDERED IN ED: Medications - No data to display   IMPRESSION / MDM / [place] / ED COURSE  I reviewed the triage vital signs and the nursing notes.                              Differential diagnosis includes, but is not limited to, Psychosis, delirium, medication effect, noncompliance, polysubstance abuse, Si, Hi, depression   Patient presents from outpatient clinic for evaluation of depression.  Patient has psych history of gad and depression.  Laboratory testing was ordered to evaluation for underlying electrolyte derangement or signs of underlying organic pathology to explain today's presentation.  Based on history and physical and laboratory evaluation, it appears that the patient's presentation is 2/2 underlying psychiatric disorder and will benefit from psychiatric  consultation.  Patient is here voluntary.  Disposition pending psychiatric evaluation.  The patient has been placed in psychiatric observation due to the need to provide a safe environment for the patient while obtaining psychiatric consultation and evaluation, as well as ongoing medical and medication management to treat the patient's condition.    Patient states she does not want to stay and wait for psychiatric evaluation.  Again she is not suicidal not homicidal does not meet criteria for IVC.  Will be given referrals for outpatient follow-up.      FINAL CLINICAL IMPRESSION(S) / ED DIAGNOSES   Final diagnoses:  Depression, unspecified depression type     Rx / DC Orders   ED Discharge Orders     None        Note:  This document was prepared using Dragon voice recognition software and may include unintentional  dictation errors.     Merlyn Lot, MD 09/25/21 [phone]

## 2021-09-25 NOTE — ED Notes (Signed)
Patient Belongings: Pair of star earring with white stones Silver colored bracelet Silver bangle bracelet with charms on bangle Hair clip Hair elastic Silver metal anklet chains x 2   Above items placed in bag and placed in patient's purse. Pair silver sandals Off white pants Underware Peach sweater with star design Off white tank top Green purse

## 2021-09-25 NOTE — ED Notes (Signed)
RN to bedside to speak with patient. Pt advised she is feeling disconnected. She has some depression and anxiety. Has used celexa in the past which worked well but recently changed to something else and feels like it is causing more issues. She denies any thoughts of wanting to harm herself or anyone else. Pt appears very calm.

## 2021-10-07 ENCOUNTER — Telehealth (INDEPENDENT_AMBULATORY_CARE_PROVIDER_SITE_OTHER): Payer: Self-pay | Admitting: Psychiatry

## 2021-10-07 ENCOUNTER — Encounter: Payer: Self-pay | Admitting: Psychiatry

## 2021-10-07 DIAGNOSIS — F331 Major depressive disorder, recurrent, moderate: Secondary | ICD-10-CM

## 2021-10-07 MED ORDER — QUETIAPINE FUMARATE 25 MG PO TABS: 25.0000 mg | ORAL_TABLET | Freq: Every day | ORAL | 0 refills | Status: DC

## 2021-10-07 MED ORDER — VENLAFAXINE HCL ER 75 MG PO CP24: 225.0000 mg | ORAL_CAPSULE | Freq: Every day | ORAL | 0 refills | Status: DC
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
## 2021-10-07 NOTE — Addendum Note (Signed)
Addended by: Norman Clay on: 10/07/2021 03:25 PM ? ? Modules accepted: Orders ? ?

## 2021-10-07 NOTE — Patient Instructions (Signed)
Continue venlafaxine 225 mg daily  ?Start quetiapine 25 mg at night  ?Discontinue amitriptyline, buspar  ?Referral to IOP ?Referral to CBT ?Next appointment: 3/27 at 3:30 ?

## 2021-10-08 ENCOUNTER — Telehealth (HOSPITAL_COMMUNITY): Payer: Self-pay | Admitting: Psychiatry

## 2021-10-08 NOTE — Telephone Encounter (Signed)
D:  Dr. Modesta Messing referred pt to [facility].  A:  Placed call and oriented pt.  Answered all her questions. Encouraged pt to verify her benefits.  Pt will start MH-IOP on 10-12-21 @ 9 a.m.  Inform Dr. Modesta Messing.  R:  Pt receptive. ?

## 2021-10-13 ENCOUNTER — Other Ambulatory Visit: Payer: Self-pay

## 2021-10-13 ENCOUNTER — Encounter (HOSPITAL_COMMUNITY): Payer: Self-pay | Admitting: Psychiatry

## 2021-10-13 ENCOUNTER — Other Ambulatory Visit (HOSPITAL_COMMUNITY): Payer: BC Managed Care – PPO | Attending: Internal Medicine | Admitting: Psychiatry

## 2021-10-13 DIAGNOSIS — F331 Major depressive disorder, recurrent, moderate: Secondary | ICD-10-CM | POA: Diagnosis present

## 2021-10-13 NOTE — Progress Notes (Signed)
Virtual Visit via Video Note ?  ?I connected with Anita Nelson on 10/13/21 at  9:00 AM EDT by a video enabled telemedicine application and verified that I am speaking with the correct person using two identifiers. ?  ?At orientation to the IOP program, Case Manager discussed the limitations of evaluation and management by telemedicine and the availability of in person appointments. The patient expressed understanding and agreed to proceed with virtual visits throughout the duration of the program. ?  ?Location:  ?Patient: Patient Home ?Provider: Clinician Home Office ?  ?History of Present Illness: ?MDD ?  ?Observations/Objective: ?Check In: Case Manager checked in with all participants to review discharge dates, insurance authorizations, work-related documents and needs from the treatment team regarding medications. Kennedie stated needs and engaged in discussion.  ?  ?Initial Therapeutic Activity: Counselor facilitated a check-in with Chaniece to assess for safety, sobriety and medication compliance.  Counselor also inquired about Jolly's current emotional ratings, as well as any significant changes in thoughts, feelings or behavior since previous check in.  Rozelia presented for session on time and was alert, oriented x5, with no evidence or self-report of active SI/HI or A/V H.  Niquita reported compliance with medication and denied use of alcohol or illicit substances.  Arnola reported scores of 6/10 for depression, 2/10 for anxiety, and 3/10 for anger/irritability.  Cathrine denied any recent outbursts or panic attacks.  Vitoria reported that a success was starting MHIOP today in order to get assistance with her current problems.  Zhanna reported that a recent struggle has been dealing with lack of motivation and energy caused by her depression.  Zareah reported that her goal today is to clean her bed off in order to improve sleep hygiene, as she has been sleeping on the couch.      ?   ?Second Therapeutic  Activity: Counselor introduced [employer], [facility] Chaplain to provide psychoeducation on topic of Grief and Loss with members today.  Curt Bears began discussion by checking in with the group about their baseline mood today, general thoughts on what grief means to them and how it has affected them personally in the past.  Curt Bears provided information on how the process of grief/loss can differ depending upon one's unique culture, and categories of loss one could experience (i.e. loss of a person, animal, relationship, job, identity, etc).  Curt Bears encouraged members to be mindful of how pervasive loss can be, and how to recognize signs which could indicate that this is having an impact on one's overall mental health and wellbeing.  Intervention was effective, as evidenced by [place] participating in discussion with speaker on the subject, reporting that she has experienced a variety of emotions related to loss, including anger, and then sadness.  Maimuna reported that exercise could be helpful for processing this anger, although she has overdone it before and injured herself.  Bridget was receptive to feedback from speaker on how to handle grief in healthier ways to avoid injury and prevent excessive compartmentalization.   ? ?Assessment and Plan: ?Counselor recommends that Ashea remain in IOP treatment to better manage mental health symptoms, ensure stability and pursue completion of treatment plan goals. Counselor recommends adherence to crisis/safety plan, taking medications as prescribed, and following up with medical professionals if any issues arise. ?  ?Follow Up Instructions: ?Counselor will send Webex link for next session. [place] was advised to call back or seek an in-person evaluation if the symptoms worsen or if the condition fails to improve as anticipated. ?  ?  Collaboration of Care:   Medication Management AEB Ricky Ala, NP  ?                                         Case Manager AEB Dellia Nims,  CNA  ? ? ?Patient/Guardian was advised Release of Information must be obtained prior to any record release in order to collaborate their care with an outside provider. Patient/Guardian was advised if they have not already done so to contact the registration department to sign all necessary forms in order for [place] to release information regarding their care.  ? ?Consent: Patient/Guardian gives verbal consent for treatment and assignment of benefits for services provided during this visit. Patient/Guardian expressed understanding and agreed to proceed. ? ?I provided 170 minutes of non-face-to-face time during this encounter. ?  ?Shade Flood, LCSW, LCAS ?10/13/21 ?

## 2021-10-13 NOTE — Plan of Care (Signed)
Pt is an active participant in her treatment plan. ?

## 2021-10-13 NOTE — Progress Notes (Signed)
Virtual Visit via Video Note ? ?I connected with Anita Nelson on @TODAY @ at  9:00 AM EST by a video enabled telemedicine application and verified that I am speaking with the correct person using two identifiers. ? ?Location: ?Patient: at home ?Provider: at office ?  ?I discussed the limitations of evaluation and management by telemedicine and the availability of in person appointments. The patient expressed understanding and agreed to proceed. ? ?I discussed the assessment and treatment plan with the patient. The patient was provided an opportunity to ask questions and all were answered. The patient agreed with the plan and demonstrated an understanding of the instructions. ?  ?The patient was advised to call back or seek an in-person evaluation if the symptoms worsen or if the condition fails to improve as anticipated. ? ?I provided 30 minutes of non-face-to-face time during this encounter. ? ? ?Jolita Haefner, RITA, M.Ed, CNA ? ? ?Patient ID: Anita Nelson, female   DOB: Feb 28, 1971, 51 y.o.   MRN: [id number] ?As per recent CCA states:  "Anita Nelson is 51 year old who presents to the ER after she was advised to do so by her PCP because of the increase symptoms of depression. Her PCP completed a referral for outpatient psychiatrist but wanted her to come to the ER to see if she can get something till she see's the outpatient provider. Patient states she has lost the motivation to do her daily activities and responsibilities. She's spending more time in the bed, having crying spells and missing work. She further reports her PCP change her medications approximately four to six months ago. She was taking Celexa and was switched to Effexor because she start having anxiety and panic attacks." ? ?Pt started MH-IOP today d/t worsening depression.  Pt reports she has had depression since age 23.  Been out of work since Feb. 6, 2023.  States her PCP wrote her out until Dec 07, 2021.  Stressors:  1) Financial  2) two adult kids  residing with her.  Reports conflict with daughter's boyfriend who comes into the home.  "My son is struggling to get a job."  3) Pain in back d/t arthritis.  Pt denies SI/HI or A/V hallucinations. ?Cc: chart for more hx ?A:  Oriented pt.  Pt was advised of ROI must be obtained prior to any record release in order to collaborate her care with an outside provider.  Pt was advised if she hasn't done so to contact the registration dept to sign all necessary forms in order for MH-IOP to release info regarding her care.  Pt gives verbal consent for treatment and assignment of benefits for services provided during this telehealth group program.  Pt expressed understanding and agreed to proceed. ?Pt will improve her mood as evidenced by being happy again, managing her mood and coping with daily stressors for 5 out of 7 days for 60 days.  Pt will f/u with Dr. Modesta Messing and is scheduled to see a psychologist next month.  Encouraged support groups.  R:  Pt receptive. ? ?Dellia Nims, M.Ed,CNA ?    ?  ?
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
## 2021-10-14 ENCOUNTER — Ambulatory Visit (HOSPITAL_COMMUNITY): Payer: BC Managed Care – PPO | Admitting: Psychiatry

## 2021-10-14 ENCOUNTER — Telehealth (HOSPITAL_COMMUNITY): Payer: Self-pay | Admitting: Psychiatry

## 2021-10-15 ENCOUNTER — Telehealth (HOSPITAL_COMMUNITY): Payer: Self-pay | Admitting: Psychiatry

## 2021-10-15 ENCOUNTER — Ambulatory Visit (HOSPITAL_COMMUNITY): Payer: BC Managed Care – PPO | Admitting: Psychiatry

## 2021-10-16 ENCOUNTER — Telehealth (HOSPITAL_COMMUNITY): Payer: Self-pay | Admitting: Psychiatry

## 2021-10-16 ENCOUNTER — Ambulatory Visit (HOSPITAL_COMMUNITY): Payer: BC Managed Care – PPO

## 2021-10-19 ENCOUNTER — Ambulatory Visit (HOSPITAL_COMMUNITY): Payer: BC Managed Care – PPO | Admitting: Psychiatry

## 2021-10-19 ENCOUNTER — Telehealth (HOSPITAL_COMMUNITY): Payer: Self-pay | Admitting: Psychiatry

## 2021-10-19 NOTE — Telephone Encounter (Signed)
D:  Pt didn't attend MH-IOP this morning; but called the case mgr back this afternoon.  Pt left vm that she's been on the phone all morning with her disability company.  "They haven't paid me and they were telling me it would be another ten days.  I told them I can't wait that long, so they promise it will be this Wed/Thurs.."  C/O of anxiety today.  Reports she will be joining the group in the morning.  A:  Encourage pt to call case manager in the mornings instead of waiting until the afternoon.  Informed pt that if she doesn't attend tomorrow; the team would be discussing discharge plans for her.  Inform treatment team.  R:  Pt receptive with returning tomorrow on time. ?

## 2021-10-20 ENCOUNTER — Other Ambulatory Visit (HOSPITAL_COMMUNITY): Payer: BC Managed Care – PPO | Admitting: Licensed Clinical Social Worker

## 2021-10-20 ENCOUNTER — Other Ambulatory Visit: Payer: Self-pay

## 2021-10-20 DIAGNOSIS — F331 Major depressive disorder, recurrent, moderate: Secondary | ICD-10-CM

## 2021-10-20 NOTE — Progress Notes (Signed)
Virtual Visit via Video Note ?  ?I connected with Anita Nelson on 10/20/21 at  9:00 AM EDT by a video enabled telemedicine application and verified that I am speaking with the correct person using two identifiers. ?  ?At orientation to the IOP program, Case Manager discussed the limitations of evaluation and management by telemedicine and the availability of in person appointments. The patient expressed understanding and agreed to proceed with virtual visits throughout the duration of the program. ?  ?Location:  ?Patient: Patient Home ?Provider: [facility] [place] [facility] ?  ?History of Present Illness: ?MDD ?  ?Observations/Objective: ?Check In: Case Manager checked in with all participants to review discharge dates, insurance authorizations, work-related documents and needs from the treatment team regarding medications. Anita Nelson stated needs and engaged in discussion.  ?  ?Initial Therapeutic Activity: Counselor facilitated a check-in with Anita Nelson to assess for safety, sobriety and medication compliance.  Counselor also inquired about Anita Nelson's current emotional ratings, as well as any significant changes in thoughts, feelings or behavior since previous check in.  Anita Nelson presented for session on time and was alert, oriented x5, with no evidence or self-report of active SI/HI or A/V H.  Anita Nelson reported compliance with medication and denied use of alcohol or illicit substances.  Anita Nelson reported scores of 3/10 for depression, 8/10 for anxiety, and 6/10 for anger.  Anita Nelson denied any recent panic attacks.  Anita Nelson reported that a recent success was making it back into group, noting that a recent struggle has been waking up on time due to issues with her sleep schedule.  Anita Nelson reported that talking with her insurance company caused an outburst yesterday, stating ?We aren't on the same page?Anita Nelson reported that her goal today is to work on cleaning off her bed in order to avoid continuing to sleep on the couch.     ? ?Second  Therapeutic Activity: Counselor introduced [employer], [facility] Chaplain to provide psychoeducation on topic of Grief and Loss with members today.  Anita Nelson began discussion by checking in with the group about their baseline mood today, general thoughts on what grief means to them and how it has affected them personally in the past.  Anita Nelson provided information on how the process of grief/loss can differ depending upon one's unique culture, and categories of loss one could experience (i.e. loss of a person, animal, relationship, job, identity, etc).  Anita Nelson encouraged members to be mindful of how pervasive loss can be, and how to recognize signs which could indicate that this is having an impact on one's overall mental health and wellbeing.  Intervention was effective, as evidenced by Anita Nelson participating in discussion with speaker on the subject, and reporting that this conversation made her think about losing both grandparents when she was younger, stating ?We went through end of life with hospice, and I was with them to the end.  There were lots of feelings that came up, both sadness and anger?Anita Nelson reported that she felt anger towards her higher power due to not comprehending why they would allow these losses to occur.  Anita Nelson stating ?I think I've also been having grief in regard to losing my youth, realizing that I'm growing older, going through menopause, and other things that have come up with my health.  Its like a smack in the face and its really been hard for me?.   ? ?Third Therapeutic Intervention: Counselor introduced topic of creating mental health maintenance plan today.  Counselor provided handout on subject to members,  which stressed the importance of maintaining one's mental health in a similar way to using diet and exercise to ensure physical health.  Counselor walked members through process of identifying triggers which could worsen symptoms, including specific people, places, and  things one needs to avoid.  Members were also tasked with identifying warning signs such as thoughts, feelings, or behaviors which could indicate mental health is at increased risk.  Counselor also facilitated conversation on self-care activities and coping strategies which members have previously utilized in the past, are currently using in daily routine, or plan to use soon to assist with managing problems or symptoms when/if they appear.  Counselor encouraged members to revisit their maintenance plan often and make changes as needed to ensure day to day stability.  Intervention was effective, as evidenced by Anita Nelson participating in activity and creating a comprehensive plan, including identification of triggers such as experiencing financial stress, or learning that one of her close supports is going through a difficult situation, and warning signs including excessive shopping for things she doesn't need, isolating from supports, getting more than 10 hours of sleep, stress migraines, or lack of appetite.  [name] also reported that she would make an effort to set aside time for self-care activities such as collecting plants or gardening, sticking to a healthy daily routine in the morning to avoid going back to bed, reading a good book, going to an [employer], or going on a trip.  She also reported that she would use coping skills such as positive self-talk in order to challenge negative thoughts, or practice meditation via an app on her phone to manage stressors more effectively.    ? ?Assessment and Plan: ?Counselor recommends that Anita Nelson remain in IOP treatment to better manage mental health symptoms, ensure stability and pursue completion of treatment plan goals. Counselor recommends adherence to crisis/safety plan, taking medications as prescribed, and following up with medical professionals if any issues arise. ?  ?Follow Up Instructions: ?Counselor will send Webex link for next session. Anita Nelson was  advised to call back or seek an in-person evaluation if the symptoms worsen or if the condition fails to improve as anticipated. ?  ?Collaboration of Care:   Medication Management AEB Ricky Ala, NP  ?                                         Case Manager AEB Dellia Nims, CNA  ? ? ?Patient/Guardian was advised Release of Information must be obtained prior to any record release in order to collaborate their care with an outside provider. Patient/Guardian was advised if they have not already done so to contact the registration department to sign all necessary forms in order for [place] to release information regarding their care.  ? ?Consent: Patient/Guardian gives verbal consent for treatment and assignment of benefits for services provided during this visit. Patient/Guardian expressed understanding and agreed to proceed. ? ?I provided 180 minutes of non-face-to-face time during this encounter. ?  ?Shade Flood, LCSW, LCAS ?10/20/21  ?

## 2021-10-20 NOTE — Progress Notes (Addendum)
NP attempted with follow-up with patient due to reported medication concerns. - No answer.  ?

## 2021-10-21 ENCOUNTER — Other Ambulatory Visit (HOSPITAL_COMMUNITY): Payer: BC Managed Care – PPO | Admitting: Licensed Clinical Social Worker

## 2021-10-21 ENCOUNTER — Other Ambulatory Visit: Payer: Self-pay

## 2021-10-21 ENCOUNTER — Encounter (HOSPITAL_COMMUNITY): Payer: Self-pay | Admitting: Licensed Clinical Social Worker

## 2021-10-21 DIAGNOSIS — F331 Major depressive disorder, recurrent, moderate: Secondary | ICD-10-CM

## 2021-10-21 MED ORDER — HYDROXYZINE PAMOATE 25 MG PO CAPS: 25.0000 mg | ORAL_CAPSULE | Freq: Every evening | ORAL | 0 refills | Status: DC | PRN

## 2021-10-21 NOTE — Progress Notes (Signed)
? ?BH MD/PA/NP OP Progress Note ? ?10/21/2021 11:15 AM ?Anita Nelson  ?MRN:  [id number] ? ?Chief Complaint: Lingering hangover since taking Seroquel ? ?HPI: Anita Nelson reported she was recently prescribed Seroquel 25 mg for reported sleep issues.  States the medications are sedating with lingering tiredness the next morning.  Discussed initiating hydroxyzine 25 to 50 mg daily.  Patient was receptive to plan.  She is denying suicidal or homicidal ideations.denies auditory visual hallucinations.  Patient to discontinue Seroquel 25 mg nightly.  Reports a good appetite.  States she is resting well with medication.  Denied depression or depressive symptoms.  Support,encouragement and  reassurance was provided. ? ? ?Visit Diagnosis:  ?  ICD-10-CM   ?1. Moderate episode of recurrent major depressive disorder (HCC)  F33.1   ?  ? ? ?Past Psychiatric History:  ? ?Past Medical History:  ?Past Medical History:  ?Diagnosis Date  ? Anemia   ? Anxiety   ? BRCA negative 10/2017  ? MyRisk neg except VUS in AXIN2, MSH3, RNF43  ? Depression   ? Family history of breast cancer   ? Family history of ovarian cancer   ? GERD (gastroesophageal reflux disease)   ? Increased risk of breast cancer 10/2017  ? IBIS=25%  ? Migraine   ? Septal defect, heart 2016  ? Dr. Saralyn Pilar septal hypokinesis  ? Shortness of breath dyspnea   ? with activity  ?  ?Past Surgical History:  ?Procedure Laterality Date  ? BREAST BIOPSY Right 03-25-14  ? Stromal fibrosis and benign calcifications  ? BREAST BIOPSY Left 02/28/2015  ? Procedure: BREAST BIOPSY WITH NEEDLE LOCALIZATION;  Surgeon: Robert Bellow, MD;  Location: [facility] ORS;  Service: General;  Laterality: Left;  ? BREAST BIOPSY Right 03/29/2018  ? affirm bx right x clip   path pending  ? BREAST EXCISIONAL BIOPSY Left 2016  ? benign  ? ENDOMETRIAL ABLATION    ? TUBAL LIGATION  2007  ? ? ?Family Psychiatric History:  ? ?Family History:  ?Family History  ?Problem Relation Age of Onset  ? Anxiety disorder Mother    ? Breast cancer Mother 19  ? Hypertension Father   ? Post-traumatic stress disorder Father   ? Ovarian cancer Paternal Grandmother   ? ? ?Social History:  ?Social History  ? ?Socioeconomic History  ? Marital status: Single  ?  Spouse name: Not on file  ? Number of children: Not on file  ? Years of education: Not on file  ? Highest education level: Not on file  ?Occupational History  ? Not on file  ?Tobacco Use  ? Smoking status: Every Day  ?  Packs/day: 1.00  ?  Years: 25.00  ?  Pack years: 25.00  ?  Types: Cigarettes  ? Smokeless tobacco: Never  ?Vaping Use  ? Vaping Use: Never used  ?Substance and Sexual Activity  ? Alcohol use: No  ? Drug use: No  ? Sexual activity: Not Currently  ?  Birth control/protection: Surgical  ?Other Topics Concern  ? Not on file  ?Social History Narrative  ? Not on file  ? ?Social Determinants of Health  ? ?Financial Resource Strain: Not on file  ?Food Insecurity: Not on file  ?Transportation Needs: Not on file  ?Physical Activity: Not on file  ?Stress: Not on file  ?Social Connections: Not on file  ? ? ?Allergies: No Known Allergies ? ?Metabolic Disorder Labs: ?No results found for: HGBA1C, MPG ?No results found for: PROLACTIN ?No results found for:  CHOL, TRIG, HDL, CHOLHDL, VLDL, LDLCALC ?Lab Results  ?Component Value Date  ? TSH 1.270 10/11/2017  ? ? ?Therapeutic Level Labs: ?No results found for: LITHIUM ?No results found for: VALPROATE ?No components found for:  CBMZ ? ?Current Medications: ?Current Outpatient Medications  ?Medication Sig Dispense Refill  ? celecoxib (CELEBREX) 400 MG capsule Take 400 mg by mouth in the morning and at bedtime.    ? estradiol-norethindrone (COMBIPATCH) 0.05-0.25 MG/DAY Place 1 patch onto the skin 2 (two) times a week. 24 patch 3  ? QUEtiapine (SEROQUEL) 25 MG tablet Take 1 tablet (25 mg total) by mouth at bedtime. 30 tablet 0  ? SUMAtriptan (IMITREX) 100 MG tablet Take 1 tablet (100 mg total) by mouth every 2 (two) hours as needed for migraine.  May repeat in 2 hours if headache persists or recurs. 10 tablet 0  ? venlafaxine XR (EFFEXOR-XR) 75 MG 24 hr capsule Take 3 capsules (225 mg total) by mouth daily. 90 capsule 0  ? ?No current facility-administered medications for this visit.  ? ? ? ?Musculoskeletal: ?Strength & Muscle Tone: within normal limits ?Gait & Station: normal ?Patient leans: N/A ? ?Psychiatric Specialty Exam: ?Review of Systems  ?There were no vitals taken for this visit.There is no height or weight on file to calculate BMI.  ?General Appearance: Casual  ?Eye Contact:  Good  ?Speech:  Clear and Coherent  ?Volume:  Normal  ?Mood:  Angry and Anxious  ?Affect:  Congruent  ?Thought Process:  Coherent  ?Orientation:  Full (Time, Place, and Person)  ?Thought Content: Logical   ?Suicidal Thoughts:  No  ?Homicidal Thoughts:  No  ?Memory:  Immediate;   Good ?Recent;   Good  ?Judgement:  Good  ?Insight:  Good  ?Psychomotor Activity:  Normal  ?Concentration:  Concentration: Negative  ?Recall:  Good  ?Fund of Knowledge: Good  ?Language: Good  ?Akathisia:  No  ?Handed:  Right  ?AIMS (if indicated): done  ?Assets:  Communication Skills  ?ADL's:  Intact  ?Cognition: WNL  ?Sleep:  Good  ? ?Screenings: ?PHQ2-9   ? ?Flowsheet Row Counselor from 10/13/2021 in [place] Video Visit from 10/07/2021 in [place]  ?PHQ-2 Total Score 6 5  ?PHQ-9 Total Score 24 15  ? ?  ? ?Flowsheet Row Counselor from 10/13/2021 in [place] Video Visit from 10/07/2021 in [place] [facility] from 09/25/2021 in [place]  ?C-SSRS RISK CATEGORY Error: Question 6 not populated No Risk No Risk  ? ?  ? ? ? ?Assessment and Plan:  ?Continue intensive outpatient programming ?-Discontinue Seroquel 25 mg nightly ?Initiated hydroxyzine 25 mg to 50 mg p.o. nightly as needed ? ?Collaboration of Care: Collaboration of Care: Medication Management AEB initiated  hydroxyzine as needed ? ?Patient/Guardian was advised Release of Information must be obtained prior to any record release in order to collaborate their care with an outside provider. Patient/Guardian was advised if they have not already done so to contact the registration department to sign all necessary forms in order for [place] to release information regarding their care.  ? ?Consent: Patient/Guardian gives verbal consent for treatment and assignment of benefits for services provided during this visit. Patient/Guardian expressed understanding and agreed to proceed.  ? ? ?Treatment plan was reviewed and agreed upon by NP T. Sairah Knobloch inpatient [place] need for continued group services ?Derrill Center, NP ?10/21/2021, 11:15 AM ? ?

## 2021-10-21 NOTE — Progress Notes (Signed)
Virtual Visit via Video Note ?  ?I connected with Anita Nelson on 10/21/21 at  9:00 AM EDT by a video enabled telemedicine application and verified that I am speaking with the correct person using two identifiers. ?  ?At orientation to the IOP program, Case Manager discussed the limitations of evaluation and management by telemedicine and the availability of in person appointments. The patient expressed understanding and agreed to proceed with virtual visits throughout the duration of the program. ?  ?Location:  ?Patient: Patient Home ?Provider: [facility] [place] [facility] ?  ?History of Present Illness: ?MDD ?  ?Observations/Objective: ?Check In: Case Manager checked in with all participants to review discharge dates, insurance authorizations, work-related documents and needs from the treatment team regarding medications. Dawnelle stated needs and engaged in discussion.  ?  ?Initial Therapeutic Activity: Counselor facilitated a check-in with Anita Nelson to assess for safety, sobriety and medication compliance.  Counselor also inquired about Kalianne's current emotional ratings, as well as any significant changes in thoughts, feelings or behavior since previous check in.  Shanequa presented for session on time and was alert, oriented x5, with no evidence or self-report of active SI/HI or A/V H.  Anita Nelson reported compliance with medication and denied use of alcohol or illicit substances.  Areej reported scores of 3/10 for depression, 7/10 for anxiety, and 4/10 for irritability.  Anita Nelson denied any recent outbursts or panic attacks.  Anita Nelson reported that a recent success was going through some mail that had piled up yesterday.  Anita Nelson reported that a struggle continues to be dealing with disability through her work. Anita Nelson reported that her goal today is to take care of some laundry.      ? ?Second Therapeutic Activity: Counselor introduced topic of anger management today.  Counselor virtually shared a handout with members on this  subject featuring a variety of coping skills, and facilitated discussion on these approaches.  Examples included raising awareness of anger triggers, practicing deep breathing, keeping an anger log to better understand episodes, using diversion activities to distract oneself for 30 minutes, taking a time out when necessary, and being mindful of warning signs tied to thoughts or behavior.  Counselor inquired about which techniques group members have used before, what has proved to be helpful, what their unique warning signs might be, as well as what they will try out in the future to assist with de-escalation.  Intervention was effective, as evidenced by Anita Nelson actively engaging in discussion on subject and reporting that she has had anger problems for some time now, and some of her warning signs include shaking, speaking at a louder volume than necessary, sleep disruption, more frequent headaches than normal, and grinding her teeth.  Anita Nelson was able to identify numerous triggers which influence her anger level, including experiencing financial stress, embarrassment, unfair judgement or criticism, rudeness, yelling, and more.  Anita Nelson reported that when she was growing up, she was raised by her grandparents, and their approach encouraged emotional suppression, so now she finds it hard to identify feelings like anger or know how to cope with them appropriately.  Anita Nelson reported that she is motivated to implement new coping strategies to manage anger in healthy ways, such as writing in an anger journal, practicing meditation, or exploring hobbies that could offer an outlet for stress in free time.   ? ?Assessment and Plan: ?Counselor recommends that Anita Nelson remain in IOP treatment to better manage mental health symptoms, ensure stability and pursue completion of treatment plan goals. Counselor recommends adherence to crisis/safety  plan, taking medications as prescribed, and following up with medical professionals if  any issues arise. ?  ?Follow Up Instructions: ?Counselor will send Webex link for next session. Anita Nelson was advised to call back or seek an in-person evaluation if the symptoms worsen or if the condition fails to improve as anticipated. ?  ?Collaboration of Care:   Medication Management AEB Ricky Ala, NP  ?                                         Case Manager AEB Anita Nims, CNA  ? ? ?Patient/Guardian was advised Release of Information must be obtained prior to any record release in order to collaborate their care with an outside provider. Patient/Guardian was advised if they have not already done so to contact the registration department to sign all necessary forms in order for [place] to release information regarding their care.  ? ?Consent: Patient/Guardian gives verbal consent for treatment and assignment of benefits for services provided during this visit. Patient/Guardian expressed understanding and agreed to proceed. ? ?I provided 180 minutes of non-face-to-face time during this encounter. ?  ?Shade Flood, LCSW, LCAS ?10/21/21  ?

## 2021-10-22 ENCOUNTER — Other Ambulatory Visit (HOSPITAL_COMMUNITY): Payer: BC Managed Care – PPO | Admitting: Licensed Clinical Social Worker

## 2021-10-22 DIAGNOSIS — F331 Major depressive disorder, recurrent, moderate: Secondary | ICD-10-CM | POA: Diagnosis not present

## 2021-10-22 NOTE — Progress Notes (Signed)
Virtual Visit via Video Note ?  ?I connected with Gabriel Cirri on 10/22/21 at  9:00 AM EDT by a video enabled telemedicine application and verified that I am speaking with the correct person using two identifiers. ?  ?At orientation to the IOP program, Case Manager discussed the limitations of evaluation and management by telemedicine and the availability of in person appointments. The patient expressed understanding and agreed to proceed with virtual visits throughout the duration of the program. ?  ?Location:  ?Patient: Patient Home ?Provider: [facility] ?  ?History of Present Illness: ?MDD ?  ?Observations/Objective: ?Check In: Case Manager checked in with all participants to review discharge dates, insurance authorizations, work-related documents and needs from the treatment team regarding medications. Demetris stated needs and engaged in discussion.  ?  ?Initial Therapeutic Activity: Counselor facilitated a check-in with Meiling to assess for safety, sobriety and medication compliance.  Counselor also inquired about Kristie's current emotional ratings, as well as any significant changes in thoughts, feelings or behavior since previous check in.  Tynslee presented for session on time and was alert, oriented x5, with no evidence or self-report of active SI/HI or A/V H.  Tanicka reported compliance with medication and denied use of alcohol or illicit substances.  Alzora reported scores of 3/10 for depression, 6/10 for anxiety, and 1/10 for anger/irritability.  Edeline denied any recent outbursts or panic attacks.  Jamiesha reported that a recent success was trying meditation in the afternoon and enjoying it, stating ?It felt very positive?Marland Kitchen  Andrika reported that a recent struggle was trying to fully complete tasks, including laundry.  Amna reported that her goal today is to take her dog for a walk since it should be warmer outside.      ? ?Second Therapeutic Activity: Counselor introduced Einar Grad,  [employer] Pharmacist, to provide psychoeducation on topic of medication compliance with members today.  Jiles Garter provided psychoeducation on classes of medications such as antidepressants, antipsychotics, what symptoms they are intended to treat, and any side effects one might encounter while on a particular prescription.  Time was allowed for clients to ask any questions they might have of [facility] regarding this specialty.  Intervention was effective, as evidenced by [place] participating in discussion with speaker on the subject, inquiring about side effects she has experienced when missing doses of her antidepressant.  Hector was receptive to feedback from pharmacist on why this happens with certain medications, and the importance of setting reminders if needed to ensure compliance with doctor's orders.   ? ?Third Therapeutic Intervention: Counselor introduced topic of building social support network today.  Counselor explained how this can be defined as having a having a group of healthy people in one's life you can talk to, spend time with, and get help from to improve both mental and physical health.  Counselor noted that some barriers can make it difficult to connect with other people, including the presence of anxiety or depression, or moving to an unfamiliar area.  Group members were asked to assess the current state of their support network, and identify ways that this could be improved.  Tips were given on how to address previously noted barriers, such as strengthening social skills, using relaxation techniques to reduce anxiety, scheduling social time each week, and/or exploring social events nearby which could increase chances of meeting new supports.  Members were also encouraged to consider getting closer to people they already know through suggestions such as outreaching someone by text, email or phone call if  they haven't spoken in awhile, doing something nice for a friend/family member unexpectedly, and/or  inviting someone over for a game/movie/dinner night.  Intervention was effective, as evidenced by [place] actively participating in discussion on the subject, and reporting that she would benefit from expanding her support network, stating ?I think it could probably use some work because I have people around me, but as far as a lot of support to rely on, its not there?Elmon Kirschner reported that due to mental health stigma, she is cautious to tell people how she really feels and connect on a deeper level, but group has given her an outlet, and she is trying to be more open and honest with her children to be a positive role model to them.  Koriana reported that her goal would be to connect with organizations like [employer] for support groups that offer a safe chance to interact with like minded peers, stating ?I like a group setting.  It gives me something to look forward to and centers me for the rest of the day?.     ? ?Assessment and Plan: ?Counselor recommends that Elaysha remain in IOP treatment to better manage mental health symptoms, ensure stability and pursue completion of treatment plan goals. Counselor recommends adherence to crisis/safety plan, taking medications as prescribed, and following up with medical professionals if any issues arise. ?  ?Follow Up Instructions: ?Counselor will send Webex link for next session. Kamirah was advised to call back or seek an in-person evaluation if the symptoms worsen or if the condition fails to improve as anticipated. ?  ?Collaboration of Care:   Medication Management AEB Ricky Ala, NP  ?                                         Case Manager AEB Dellia Nims, CNA  ? ? ?Patient/Guardian was advised Release of Information must be obtained prior to any record release in order to collaborate their care with an outside provider. Patient/Guardian was advised if they have not already done so to contact the registration department to sign all necessary forms in order  for [place] to release information regarding their care.  ? ?Consent: Patient/Guardian gives verbal consent for treatment and assignment of benefits for services provided during this visit. Patient/Guardian expressed understanding and agreed to proceed. ? ?I provided 180 minutes of non-face-to-face time during this encounter. ?  ?Shade Flood, LCSW, LCAS ?10/22/21  ?

## 2021-10-23 ENCOUNTER — Other Ambulatory Visit (HOSPITAL_COMMUNITY): Payer: BC Managed Care – PPO | Admitting: Licensed Clinical Social Worker

## 2021-10-23 ENCOUNTER — Other Ambulatory Visit: Payer: Self-pay

## 2021-10-23 DIAGNOSIS — F331 Major depressive disorder, recurrent, moderate: Secondary | ICD-10-CM

## 2021-10-23 NOTE — Progress Notes (Signed)
Virtual Visit via Video Note ?  ?I connected with Anita Nelson on 10/23/21 at  9:00 AM EDT by a video enabled telemedicine application and verified that I am speaking with the correct person using two identifiers. ?  ?At orientation to the IOP program, Case Manager discussed the limitations of evaluation and management by telemedicine and the availability of in person appointments. The patient expressed understanding and agreed to proceed with virtual visits throughout the duration of the program. ?  ?Location:  ?Patient: Patient Home ?Provider: [facility] [place] [facility] ?  ?History of Present Illness: ?MDD ?  ?Observations/Objective: ?Check In: Case Manager checked in with all participants to review discharge dates, insurance authorizations, work-related documents and needs from the treatment team regarding medications. Anita Nelson stated needs and engaged in discussion.  ?  ?Initial Therapeutic Activity: Counselor facilitated a check-in with Anita Nelson to assess for safety, sobriety and medication compliance.  Counselor also inquired about Anita Nelson's current emotional ratings, as well as any significant changes in thoughts, feelings or behavior since previous check in.  Anita Nelson presented for session on time and was alert, oriented x5, with no evidence or self-report of active SI/HI or A/V H.  Anita Nelson reported compliance with medication and denied use of alcohol or illicit substances.  Anita Nelson reported scores of 3/10 for depression, 5/10 for anxiety, and 2/10 for anger/irritability.  Anita Nelson denied any recent panic attacks.  Anita Nelson reported that a recent success was working on her plants and reorganizing her kitchen, stating ?It gave me a sense of accomplishment?Anita Nelson reported that a recent struggle was experiencing some outbursts yesterday due to stress from the kids.  Anita Nelson reported that her goal this weekend is to continue working on organizing the home.     ? ?Second Therapeutic Activity: Counselor engaged the group in  discussion on managing work/life balance today to improve mental health and wellness.  Counselor explained how finding balance between responsibilities at home and work place can be challenging, lead to increased stress, and this has been further complicated by recent pandemic leading to unemployment, more virtual work, and blurring of lines between home as a place of rest or work duties.  Counselor facilitated discussion on what challenges members have faced with this issue historically, as well as what, if any, issues have arisen following pandemic. Counselor also discussed strategies for improving work/life balance while members work on their mental health during treatment.  Some of these included keeping track of time management; creating a list of priorities and scaling importance; setting realistic, measurable goals each day; establishing boundaries; taking care of health needs; and nurturing relationships at home and work for support.  Counselor inquired about areas where members feel they are excelling, as well as areas they could focus on during treatment.   Intervention was effective, as evidenced by Anita Nelson successfully participating in discussion on subject and reporting that her current job has been extremely stressful for her, and led to a work life imbalance for some time.  Anita Nelson reported that she has experienced burnout symptoms, including headaches, fatigue, lack of motivation, feeling overwhelmed, withdrawing from responsibilities, isolation from others, and procrastination.  Anita Nelson reported that this led her to be more of a homebody, not as often going to events where she could socialize, stating ?I have no time for myself?Anita Nelson was receptive to tips offered today to correct imbalance, including keeping an activities time log at home to ensure she has more attention on self-care,  prioritizing time for things she likes to  do like watching movies or time with family,  keeping a to do list  in order to stay on top of daily tasks and avoid becoming overwhelmed, focus on leaving work tasks at work, use assertive communication skills to set boundaries, prioritize more quality time with family, and develop an exercise routine.  Anita Nelson also reported that she would consider looking into other jobs that offer more flexibility to ensure she is not as stressed and has time for socialization and fun activities when she gets off shift.   ? ?Assessment and Plan: ?Counselor recommends that Anita Nelson remain in IOP treatment to better manage mental health symptoms, ensure stability and pursue completion of treatment plan goals. Counselor recommends adherence to crisis/safety plan, taking medications as prescribed, and following up with medical professionals if any issues arise. ?  ?Follow Up Instructions: ?Counselor will send Webex link for next session. Anita Nelson was advised to call back or seek an in-person evaluation if the symptoms worsen or if the condition fails to improve as anticipated. ?  ?Collaboration of Care:   Medication Management AEB Anita Ala, NP  ?                                         Case Manager AEB Dellia Nims, CNA  ? ? ?Patient/Guardian was advised Release of Information must be obtained prior to any record release in order to collaborate their care with an outside provider. Patient/Guardian was advised if they have not already done so to contact the registration department to sign all necessary forms in order for [place] to release information regarding their care.  ? ?Consent: Patient/Guardian gives verbal consent for treatment and assignment of benefits for services provided during this visit. Patient/Guardian expressed understanding and agreed to proceed. ? ?I provided 165 minutes of non-face-to-face time during this encounter. ?  ?Shade Flood, LCSW, LCAS ?10/23/21  ?

## 2021-10-26 ENCOUNTER — Other Ambulatory Visit (HOSPITAL_COMMUNITY): Payer: BC Managed Care – PPO | Admitting: Licensed Clinical Social Worker

## 2021-10-26 ENCOUNTER — Other Ambulatory Visit: Payer: Self-pay

## 2021-10-26 DIAGNOSIS — F331 Major depressive disorder, recurrent, moderate: Secondary | ICD-10-CM

## 2021-10-26 NOTE — Progress Notes (Signed)
Virtual Visit via Video Note ?  ?I connected with Anita Nelson on 10/26/21 at  9:00 AM EDT by a video enabled telemedicine application and verified that I am speaking with the correct person using two identifiers. ?  ?At orientation to the IOP program, Case Manager discussed the limitations of evaluation and management by telemedicine and the availability of in person appointments. The patient expressed understanding and agreed to proceed with virtual visits throughout the duration of the program. ?  ?Location:  ?Patient: Patient Home ?Provider: [facility] [place] [facility] ?  ?History of Present Illness: ?MDD ?  ?Observations/Objective: ?Check In: Case Manager checked in with all participants to review discharge dates, insurance authorizations, work-related documents and needs from the treatment team regarding medications. Anita Nelson stated needs and engaged in discussion.  ?  ?Initial Therapeutic Activity: Counselor facilitated a check-in with Anita Nelson to assess for safety, sobriety and medication compliance.  Counselor also inquired about Anita Nelson's current emotional ratings, as well as any significant changes in thoughts, feelings or behavior since previous check in.  Anita Nelson presented for session on time and was alert, oriented x5, with no evidence or self-report of active SI/HI or A/V H.  Anita Nelson reported compliance with medication and denied use of alcohol or illicit substances.  Anita Nelson reported scores of 2/10 for depression, 5/10 for anxiety, and 0/10 for anger/irritability.  Anita Nelson denied any recent outbursts or panic attacks.  Anita Nelson reported that a recent success was taking time over the weekend to organize her home, repotting some plants, and watching a basketball game.  Anita Nelson denied any present struggles.  Anita Nelson reported that her goal today is relax after group.      ? ?Second Therapeutic Activity: Counselor introduced topic of stress management today.  Counselor provided definition of stress as feeling tense,  overwhelmed, worn out, and/or exhausted, and noted that in small amounts, stress can be motivating until things become too overwhelming to manage.  Counselor also explained how stress can be acute (brief but intense) or chronic (long-lasting) and this can impact the severity of symptoms one can experience in the physical, emotional, and behavioral categories.  Counselor inquired about members' specific stressors, how long they have been prevalent, and the various symptoms that tend to manifest as a result.  Counselor also offered several stress management strategies to help improve members' coping ability, including journaling, gratitude practice, relaxation techniques, and time management tips.  Counselor also explained that research has shown a strong support network composed of trusted family, friends, or community members can increase resilience in times of stress, and inquired about who members can reach out to for help in managing stressors.  Counselor encouraged members to consider discussing stressor 'red flags' with their close supports that can be monitored and strategies for assisting them in times of crisis.  Intervention was effective, as evidenced by Anita Nelson actively participating in discussion on subject and reporting that that some of her most significant stressors include public speaking, global warming, world economy, and terrorism.  Anita Nelson reported that her ?Environment at home is chaotic? due to living with her adult children as well.  Anita Nelson reported that her stress management goal is to reduce her average stress level from 7/10 most days down to 5/10 in severity over the next month by learning to break daily goals into more manageable tasks in order to view them as less overwhelming.  Anita Nelson reported that she would plan to utilize tools offered today such as deep breathing, meditation with positive affirmations, and planning for positive  activities in her schedule to look forward to such as  walking the dog outside when weather is nicer.  Anita Nelson stating ?Focusing on my breathing centers and grounds me.  It gives me clarity and keeps my mind from racing?.    ? ?Assessment and Plan: ?Counselor recommends that Anita Nelson remain in IOP treatment to better manage mental health symptoms, ensure stability and pursue completion of treatment plan goals. Counselor recommends adherence to crisis/safety plan, taking medications as prescribed, and following up with medical professionals if any issues arise. ?  ?Follow Up Instructions: ?Counselor will send Webex link for next session. Anita Nelson was advised to call back or seek an in-person evaluation if the symptoms worsen or if the condition fails to improve as anticipated. ?  ?Collaboration of Care:   Medication Management AEB Ricky Ala, NP  ?                                         Case Manager AEB Dellia Nims, CNA  ? ? ?Patient/Guardian was advised Release of Information must be obtained prior to any record release in order to collaborate their care with an outside provider. Patient/Guardian was advised if they have not already done so to contact the registration department to sign all necessary forms in order for [place] to release information regarding their care.  ? ?Consent: Patient/Guardian gives verbal consent for treatment and assignment of benefits for services provided during this visit. Patient/Guardian expressed understanding and agreed to proceed. ? ?I provided 180 minutes of non-face-to-face time during this encounter. ?  ?Shade Flood, LCSW, LCAS ?10/26/21  ?

## 2021-10-27 ENCOUNTER — Other Ambulatory Visit (HOSPITAL_COMMUNITY): Payer: BC Managed Care – PPO | Admitting: Licensed Clinical Social Worker

## 2021-10-27 ENCOUNTER — Other Ambulatory Visit: Payer: Self-pay

## 2021-10-27 DIAGNOSIS — F331 Major depressive disorder, recurrent, moderate: Secondary | ICD-10-CM

## 2021-10-27 NOTE — Progress Notes (Signed)
Virtual Visit via Video Note ?  ?I connected with Anita Nelson on 10/27/21 at  9:00 AM EDT by a video enabled telemedicine application and verified that I am speaking with the correct person using two identifiers. ?  ?At orientation to the IOP program, Case Manager discussed the limitations of evaluation and management by telemedicine and the availability of in person appointments. The patient expressed understanding and agreed to proceed with virtual visits throughout the duration of the program. ?  ?Location:  ?Patient: Patient Home ?Provider: Clinician Home Office ?  ?History of Present Illness: ?MDD ?  ?Observations/Objective: ?Check In: Case Manager checked in with all participants to review discharge dates, insurance authorizations, work-related documents and needs from the treatment team regarding medications. Anita Nelson stated needs and engaged in discussion.  ?  ?Initial Therapeutic Activity: Counselor facilitated a check-in with Anita Nelson to assess for safety, sobriety and medication compliance.  Counselor also inquired about Anita Nelson's current emotional ratings, as well as any significant changes in thoughts, feelings or behavior since previous check in.  Anita Nelson presented for session on time and was alert, oriented x5, with no evidence or self-report of active SI/HI or A/V H.  Anita Nelson reported compliance with medication and denied use of alcohol or illicit substances.  Anita Nelson reported scores of 3/10 for depression, 8/10 for anxiety, and 2/10 for anger/irritability.  Anita Nelson denied any recent outbursts or panic attacks.  Anita Nelson reported that a recent success was taking time to relax yesterday, stating ?I just took it easy and read a book?Marland Kitchen  Anita Nelson reported that a current struggle is dealing with anxiety regarding her daughter's living situation, as she will need to move in with Anita Nelson temporarily.  Anita Nelson reported that her goal today will be to get a facial and write in her journal for self-care.      ? ?Second Therapeutic Activity: Counselor introduced Anita Nelson, Anita Nelson to provide psychoeducation on topic of Grief and Loss with members today.  Anita Nelson began discussion by checking in with the group about their baseline mood today, general thoughts on what grief means to them and how it has affected them personally in the past.  Anita Nelson provided information on how the process of grief/loss can differ depending upon one's unique culture, and categories of loss one could experience (i.e. loss of a person, animal, relationship, job, identity, etc).  Anita Nelson encouraged members to be mindful of how pervasive loss can be, and how to recognize signs which could indicate that this is having an impact on one's overall mental health and wellbeing.  Intervention was effective, as evidenced by Anita Nelson participating in discussion with speaker on the subject, reporting that she is actively grieving the loss of a mentor figure in her life, and something which brings her joy and helps her cope with loss is watching standup comedy shows, or reflecting on the memory of going to a comedy show last year with her daughter, stating ?Thinking about something funny puts me in a better space?.     ? ?Third Therapeutic Activity: Counselor introduced topic of grounding skills today.  Counselor defined these as simple strategies one can use to help detach from difficult thoughts or feelings temporarily by focusing on something else.  Counselor noted that grounding will not solve the problem at hand, but can provide the practitioner with time to regain control over their thoughts and/or feelings and prevent the situation from getting worse (i.e. interrupting a panic attack).  Counselor divided these into three categories (mental, physical, and  soothing) and then provided examples of each which group members could practice during session.  Some of these included describing one's environment in detail or playing a categories game  with oneself for mental category, taking a hot bath/shower, stretching, or carrying a grounding object for physical category, and saying kind statements, or visualizing people one cares about for soothing category.  Counselor inquired about which techniques members have used with success in the past, or will commit to learning, practicing, and applying now to improve coping abilities.  Intervention was effective, as evidenced by Anita Nelson participating in discussion on the subject, trying out several of the techniques during session, and expressing interest in adding several to her available coping skills, such as using her imagination to visualize being at the beach, reading an Russell book, thinking of something funny to make her laugh, or manipulating a grounding object for distraction such as a rubberband.   ? ?Assessment and Plan: ?Counselor recommends that Anita Nelson remain in IOP treatment to better manage mental health symptoms, ensure stability and pursue completion of treatment plan goals. Counselor recommends adherence to crisis/safety plan, taking medications as prescribed, and following up with medical professionals if any issues arise. ?  ?Follow Up Instructions: ?Counselor will send Webex link for next session. Anita Nelson was advised to call back or seek an in-person evaluation if the symptoms worsen or if the condition fails to improve as anticipated. ?  ?Collaboration of Care:   Medication Management AEB Ricky Ala, NP  ?                                         Case Manager AEB Dellia Nims, CNA  ? ? ?Patient/Guardian was advised Release of Information must be obtained prior to any record release in order to collaborate their care with an outside provider. Patient/Guardian was advised if they have not already done so to contact the registration department to sign all necessary forms in order for [place] to release information regarding their care.  ? ?Consent: Patient/Guardian gives verbal consent  for treatment and assignment of benefits for services provided during this visit. Patient/Guardian expressed understanding and agreed to proceed. ? ?I provided 180 minutes of non-face-to-face time during this encounter. ?  ?Shade Flood, LCSW, LCAS ?10/27/21  ?

## 2021-10-28 ENCOUNTER — Other Ambulatory Visit (HOSPITAL_COMMUNITY): Payer: BC Managed Care – PPO | Admitting: Licensed Clinical Social Worker

## 2021-10-28 ENCOUNTER — Other Ambulatory Visit: Payer: Self-pay

## 2021-10-28 DIAGNOSIS — F331 Major depressive disorder, recurrent, moderate: Secondary | ICD-10-CM

## 2021-10-28 NOTE — Progress Notes (Signed)
Virtual Visit via Video Note ?  ?I connected with Gabriel Cirri on 10/28/21 at  9:00 AM EDT by a video enabled telemedicine application and verified that I am speaking with the correct person using two identifiers. ?  ?At orientation to the IOP program, Case Manager discussed the limitations of evaluation and management by telemedicine and the availability of in person appointments. The patient expressed understanding and agreed to proceed with virtual visits throughout the duration of the program. ?  ?Location:  ?Patient: Patient Home ?Provider: [facility] ?  ?History of Present Illness: ?MDD ?  ?Observations/Objective: ?Check In: Case Manager checked in with all participants to review discharge dates, insurance authorizations, work-related documents and needs from the treatment team regarding medications. Haani stated needs and engaged in discussion.  ?  ?Initial Therapeutic Activity: Counselor facilitated a check-in with [place] to assess for safety, sobriety and medication compliance.  Counselor also inquired about Jarae's current emotional ratings, as well as any significant changes in thoughts, feelings or behavior since previous check in.  Torria presented for session on time and was alert, oriented x5, with no evidence or self-report of active SI/HI or A/V H.  Rilla reported compliance with medication and denied use of alcohol or illicit substances.  Alanny reported scores of 4/10 for depression, 7/10 for anxiety, and 2/10 for anger/irritability.  Elenie denied any recent outbursts or panic attacks.  Dailin reported that a recent success was cleaning up the home for self-care.  Nickie reported that a recent struggle was ruminating on whether or not her psychiatrist has her diagnosis wrong, stating ?I think I might be bipolar actually?Elmon Kirschner reported that ger goal is to speak about this concern at her next appointment with them.   ? ?Second Therapeutic Activity: Counselor introduced Einar Grad, [employer] Pharmacist, to provide psychoeducation on topic of medication compliance with members today.  Jiles Garter provided psychoeducation on classes of medications such as antidepressants, antipsychotics, what symptoms they are intended to treat, and any side effects one might encounter while on a particular prescription.  Time was allowed for clients to ask any questions they might have of [facility] regarding this specialty.  Intervention effectiveness could not be measured, as Chan did not engage in discussion on subject, although she was observed following along with presentation.   ? ?Third Therapeutic Activity: Psycho-educational portion of group was co-facilitated by wellness director (Frederich Balding, MS, MPH, CHES) focused on self-care in daily life. Facilitator and group members discussed presented materials regarding importance of sleep, diet, and exercise. Group members discussed any changes they are willing to make to improve an area of self-care in their lives (physical, psychological, emotional, spiritual, relationship, professional) to improve overall mental health as they continue with treatment.  Intervention was effective, as evidenced by [place] participating in discussion with speaker on the subject, reporting that one area of wellness that used to be important for her was getting regular exercise, since it gave her energy, improved her mood, and gave her a sense of accomplishment.  Sherelle reported that followed an injury to her rotator cuff, she has not been exercising as much and would benefit from beginning a less strenuous routine until she can return to the gym, such as walking her dog daily.  Jaycelynn also expressed interest in cutting back on coffee intake to avoid dependency, and include more water throughout the day instead.   ? ?Assessment and Plan: ?Counselor recommends that Dwayne remain in IOP treatment to better manage mental health symptoms,  ensure stability and pursue completion of  treatment plan goals. Counselor recommends adherence to crisis/safety plan, taking medications as prescribed, and following up with medical professionals if any issues arise. ?  ?Follow Up Instructions: ?Counselor will send Webex link for next session. Ruthann was advised to call back or seek an in-person evaluation if the symptoms worsen or if the condition fails to improve as anticipated. ?  ?Collaboration of Care:   Medication Management AEB Ricky Ala, NP  ?                                         Case Manager AEB Dellia Nims, CNA  ? ? ?Patient/Guardian was advised Release of Information must be obtained prior to any record release in order to collaborate their care with an outside provider. Patient/Guardian was advised if they have not already done so to contact the registration department to sign all necessary forms in order for [place] to release information regarding their care.  ? ?Consent: Patient/Guardian gives verbal consent for treatment and assignment of benefits for services provided during this visit. Patient/Guardian expressed understanding and agreed to proceed. ? ?I provided 180 minutes of non-face-to-face time during this encounter. ?  ?Shade Flood, LCSW, LCAS ?10/28/21  ?

## 2021-10-28 NOTE — Progress Notes (Signed)
Virtual Visit via Video Note ? ?I connected with Anita Nelson on 11/02/21 at  3:30 PM EDT by a video enabled telemedicine application and verified that I am speaking with the correct person using two identifiers. ? ?Location: ?Patient: home ?Provider: office ?Persons participated in the visit- patient, provider  ?  ?I discussed the limitations of evaluation and management by telemedicine and the availability of in person appointments. The patient expressed understanding and agreed to proceed. ?  ?I discussed the assessment and treatment plan with the patient. The patient was provided an opportunity to ask questions and all were answered. The patient agreed with the plan and demonstrated an understanding of the instructions. ?  ?The patient was advised to call back or seek an in-person evaluation if the symptoms worsen or if the condition fails to improve as anticipated. ? ?I provided 18 minutes of non-face-to-face time during this encounter. ? ? ?Norman Clay, MD ? ? ? ?BH MD/PA/NP OP Progress Note ? ?11/02/2021 4:04 PM ?Anita Nelson  ?MRN:  [id number] ? ?Chief Complaint:  ?Chief Complaint  ?Patient presents with  ? Depression  ? Follow-up  ? ?HPI:  ?This is a follow-up appointment for depression and anxiety.  ?She states that she likes the group therapy.  She felt that she was not alone, hearing from other people's perspective.  She is hoping to continue group therapy at [facility].  She tries to clean and organize things at home as it is out of control.  She refers to clothes in the room.  She also feels anxious about her daughter moving into her place for a few months due to raise in the apartment.  She tries to do what needs to be done.  She enjoys plants.  She was able to go outside twice a week.  She continues to feel depressed and fatigue.  She is not sure of any change in her weight or appetite.  She denies SI.  She denies panic attacks since her last visit.  She could not continue quetiapine due to  drowsiness.  She likes hydroxyzine better.  She is willing to try bupropion at this time.  ? ? ?Daily routine: stays at home ?Exercise: none ?Support:best friend ?Household: son (106 yo), daughter (19 yo) ?Marital status:single.  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 Hb wnl, TSH wnl ? ? ?Visit Diagnosis:  ?  ICD-10-CM   ?1. Moderate episode of recurrent major depressive disorder (HCC)  F33.1   ?  ?2. Anxiety state  F41.1   ?  ? ? ?Past Psychiatric History: Please see initial evaluation for full details. I have reviewed the history. No updates at this time.  ?  ? ?Past Medical History:  ?Past Medical History:  ?Diagnosis Date  ? Anemia   ? Anxiety   ? BRCA negative 10/2017  ? MyRisk neg except VUS in AXIN2, MSH3, RNF43  ? Depression   ? Family history of breast cancer   ? Family history of ovarian cancer   ? GERD (gastroesophageal reflux disease)   ? Increased risk of breast cancer 10/2017  ? IBIS=25%  ? Migraine   ? Septal defect, heart 2016  ? Dr. Saralyn Pilar septal hypokinesis  ? Shortness of breath dyspnea   ? with activity  ?  ?Past Surgical History:  ?Procedure Laterality Date  ? BREAST BIOPSY Right 03-25-14  ? Stromal fibrosis and benign  calcifications  ? BREAST BIOPSY Left 02/28/2015  ? Procedure: BREAST BIOPSY WITH NEEDLE LOCALIZATION;  Surgeon: Jeffrey W Byrnett, MD;  Location: [facility] ORS;  Service: General;  Laterality: Left;  ? BREAST BIOPSY Right 03/29/2018  ? affirm bx right x clip   path pending  ? BREAST EXCISIONAL BIOPSY Left 2016  ? benign  ? ENDOMETRIAL ABLATION    ? TUBAL LIGATION  2007  ? ? ?Family Psychiatric History: Please see initial evaluation for full details. I have reviewed the history. No updates at this time.  ? ? ?Family History:  ?Family History  ?Problem Relation Age of Onset  ? Anxiety disorder Mother   ? Breast cancer Mother 41  ? Hypertension Father   ? Post-traumatic  stress disorder Father   ? Ovarian cancer Paternal Grandmother   ? ? ?Social History:  ?Social History  ? ?Socioeconomic History  ? Marital status: Single  ?  Spouse name: Not on file  ? Number of children: Not on file  ? Years of education: Not on file  ? Highest education level: Not on file  ?Occupational History  ? Not on file  ?Tobacco Use  ? Smoking status: Every Day  ?  Packs/day: 1.00  ?  Years: 25.00  ?  Pack years: 25.00  ?  Types: Cigarettes  ? Smokeless tobacco: Never  ?Vaping Use  ? Vaping Use: Never used  ?Substance and Sexual Activity  ? Alcohol use: No  ? Drug use: No  ? Sexual activity: Not Currently  ?  Birth control/protection: Surgical  ?Other Topics Concern  ? Not on file  ?Social History Narrative  ? Not on file  ? ?Social Determinants of Health  ? ?Financial Resource Strain: Not on file  ?Food Insecurity: Not on file  ?Transportation Needs: Not on file  ?Physical Activity: Not on file  ?Stress: Not on file  ?Social Connections: Not on file  ? ? ?Allergies: No Known Allergies ? ?Metabolic Disorder Labs: ?No results found for: HGBA1C, MPG ?No results found for: PROLACTIN ?No results found for: CHOL, TRIG, HDL, CHOLHDL, VLDL, LDLCALC ?Lab Results  ?Component Value Date  ? TSH 1.270 10/11/2017  ? ? ?Therapeutic Level Labs: ?No results found for: LITHIUM ?No results found for: VALPROATE ?No components found for:  CBMZ ? ?Current Medications: ?Current Outpatient Medications  ?Medication Sig Dispense Refill  ? buPROPion (WELLBUTRIN XL) 150 MG 24 hr tablet Take 1 tablet (150 mg total) by mouth daily. 30 tablet 1  ? celecoxib (CELEBREX) 400 MG capsule Take 400 mg by mouth in the morning and at bedtime.    ? estradiol-norethindrone (COMBIPATCH) 0.05-0.25 MG/DAY Place 1 patch onto the skin 2 (two) times a week. 24 patch 3  ? [START ON 11/21/2021] hydrOXYzine (VISTARIL) 25 MG capsule Take 1 capsule (25 mg total) by mouth daily as needed for anxiety. 30 capsule 0  ? SUMAtriptan (IMITREX) 100 MG tablet Take 1  tablet (100 mg total) by mouth every 2 (two) hours as needed for migraine. May repeat in 2 hours if headache persists or recurs. 10 tablet 0  ? [START ON 11/07/2021] venlafaxine XR (EFFEXOR-XR) 75 MG 24 hr capsule Take 3 capsules (225 mg total) by mouth daily. 90 capsule 1  ? ?No current facility-administered medications for this visit.  ? ? ? ?Musculoskeletal: ?Strength & Muscle Tone:  N/A ?Gait & Station:  N/A ?Patient leans: N/A ? ?Psychiatric Specialty Exam: ?Review of Systems  ?Psychiatric/Behavioral:  Positive for dysphoric mood and sleep disturbance. Negative   for agitation, behavioral problems, confusion, decreased concentration, hallucinations, self-injury and suicidal ideas. The patient is nervous/anxious. The patient is not hyperactive.   ?All other systems reviewed and are negative.  ?There were no vitals taken for this visit.There is no height or weight on file to calculate BMI.  ?General Appearance: Fairly Groomed  ?Eye Contact:  Good  ?Speech:  Clear and Coherent  ?Volume:  Normal  ?Mood:   better  ?Affect:  Appropriate, Congruent, and down at times  ?Thought Process:  Coherent  ?Orientation:  Full (Time, Place, and Person)  ?Thought Content: Logical   ?Suicidal Thoughts:  No  ?Homicidal Thoughts:  No  ?Memory:  Immediate;   Good  ?Judgement:  Good  ?Insight:  Good  ?Psychomotor Activity:  Normal  ?Concentration:  Concentration: Good and Attention Span: Good  ?Recall:  Good  ?Fund of Knowledge: Good  ?Language: Good  ?Akathisia:  No  ?Handed:  Right  ?AIMS (if indicated): not done  ?Assets:  Communication Skills ?Desire for Improvement  ?ADL's:  Intact  ?Cognition: WNL  ?Sleep:  Fair  ? ?Screenings: ?PHQ2-9   ? ?Flowsheet Row Counselor from 10/13/2021 in [facility] Video Visit from 10/07/2021 in [facility]  ?PHQ-2 Total Score 6 5  ?PHQ-9 Total Score 24 15  ? ?  ? ?Flowsheet Row Counselor from 10/13/2021 in BEHAVIORAL HEALTH INTENSIVE PSYCH Video Visit from  10/07/2021 in [facility] ED from 09/25/2021 in [facility] EMERGENCY DEPARTMENT  ?C-SSRS RISK CATEGORY Error: Question 6 not populated No Risk No Risk  ? ?

## 2021-10-29 ENCOUNTER — Other Ambulatory Visit: Payer: Self-pay

## 2021-10-29 ENCOUNTER — Other Ambulatory Visit (HOSPITAL_COMMUNITY): Payer: BC Managed Care – PPO | Admitting: Licensed Clinical Social Worker

## 2021-10-29 DIAGNOSIS — F331 Major depressive disorder, recurrent, moderate: Secondary | ICD-10-CM

## 2021-10-29 NOTE — Progress Notes (Signed)
Virtual Visit via Video Note ?  ?I connected with Anita Nelson on 10/29/21 at  9:00 AM EDT by a video enabled telemedicine application and verified that I am speaking with the correct person using two identifiers. ?  ?At orientation to the IOP program, Case Manager discussed the limitations of evaluation and management by telemedicine and the availability of in person appointments. The patient expressed understanding and agreed to proceed with virtual visits throughout the duration of the program. ?  ?Location:  ?Patient: Patient Home ?Provider: [facility] [place] [facility] ?  ?History of Present Illness: ?MDD ?  ?Observations/Objective: ?Check In: Case Manager checked in with all participants to review discharge dates, insurance authorizations, work-related documents and needs from the treatment team regarding medications. Whittney stated needs and engaged in discussion.  ?  ?Initial Therapeutic Activity: Counselor facilitated a check-in with Ralonda to assess for safety, sobriety and medication compliance.  Counselor also inquired about Fareeda's current emotional ratings, as well as any significant changes in thoughts, feelings or behavior since previous check in.  Merlinda presented for session on time and was alert, oriented x5, with no evidence or self-report of active SI/HI or A/V H.  Brylinn reported compliance with medication and denied use of alcohol or illicit substances.  Deseri reported scores of 2/10 for depression, 5/10 for anxiety, and 1/10 for anger/irritability.  Alicianna denied any recent outbursts or panic attacks.  Brexley reported that a recent success was doing spring cleaning around the house yesterday.  Deztiny denied any new struggles.  Koral reported that her goal today is to practice her Spanish with an app on her phone.     ? ?Second Therapeutic Activity: Counselor introduced topic of self-care today.  Counselor explained how this can be defined as the things one does to maintain good health and improve  well-being.  Counselor provided members with a self-care assessment form to complete.  This handout featured various sub-categories of self-care, including physical, psychological/emotional, social, spiritual, and professional.  Members were asked to rank their engagement in the activities listed for each dimension on a scale of 1-3, with 1 indicating 'Poor', 2 indicating 'Mounds', and 3 indicating 'Well'.  Counselor invited members to share results of their assessment, and inquired about which areas of self-care they are doing well in, as well as areas that require attention, and how they plan to begin addressing this during treatment. Intervention was effective, as evidenced by [place] successfully completing initial 2 sections of assessment and actively engaging in discussion on subject, reporting that she is excelling in areas such as wearing clothes that make her feel good about herself, and finding reasons to laugh, but would benefit from focusing more on areas such as taking care of personal hygiene, exercising, eating regularly, participating in hobbies, getting away from distractions, and talking about her problems.  Kaleigh reported that she would work to improve self-care deficits by getting up early to put makeup on each day, doing light exercise daily to get back into a routine such as walking the dog or doing yoga, eating breakfast each day, using group as a chance to express feelings and get helpful feedback from peers, and setting healthier boundaries with social media to reduce distraction.     ? ?Assessment and Plan: ?Counselor recommends that Genelle remain in IOP treatment to better manage mental health symptoms, ensure stability and pursue completion of treatment plan goals. Counselor recommends adherence to crisis/safety plan, taking medications as prescribed, and following up with medical professionals if any issues  arise. ?  ?Follow Up Instructions: ?Counselor will send Webex link for next  session. Livia was advised to call back or seek an in-person evaluation if the symptoms worsen or if the condition fails to improve as anticipated. ?  ?Collaboration of Care:   Medication Management AEB Ricky Ala, NP  ?                                         Case Manager AEB Dellia Nims, CNA  ? ? ?Patient/Guardian was advised Release of Information must be obtained prior to any record release in order to collaborate their care with an outside provider. Patient/Guardian was advised if they have not already done so to contact the registration department to sign all necessary forms in order for [place] to release information regarding their care.  ? ?Consent: Patient/Guardian gives verbal consent for treatment and assignment of benefits for services provided during this visit. Patient/Guardian expressed understanding and agreed to proceed. ? ?I provided 180 minutes of non-face-to-face time during this encounter. ?  ?Shade Flood, LCSW, LCAS ?10/29/21  ?

## 2021-10-30 ENCOUNTER — Other Ambulatory Visit (HOSPITAL_COMMUNITY): Payer: BC Managed Care – PPO | Admitting: Psychiatry

## 2021-10-30 ENCOUNTER — Other Ambulatory Visit: Payer: Self-pay

## 2021-10-30 DIAGNOSIS — F331 Major depressive disorder, recurrent, moderate: Secondary | ICD-10-CM

## 2021-10-30 NOTE — Progress Notes (Signed)
Virtual Visit via Video Note ?  ?I connected with Anita Nelson on 10/30/21 at  9:00 AM EDT by a video enabled telemedicine application and verified that I am speaking with the correct person using two identifiers. ?  ?At orientation to the IOP program, Case Manager discussed the limitations of evaluation and management by telemedicine and the availability of in person appointments. The patient expressed understanding and agreed to proceed with virtual visits throughout the duration of the program. ?  ?Location:  ?Patient: Patient Home ?Provider: [facility] [place] [facility] ?  ?History of Present Illness: ?MDD ?  ?Observations/Objective: ?Check In: Case Manager checked in with all participants to review discharge dates, insurance authorizations, work-related documents and needs from the treatment team regarding medications. Minahil stated needs and engaged in discussion.  ?  ?Initial Therapeutic Activity: Counselor facilitated a check-in with Neeva to assess for safety, sobriety and medication compliance.  Counselor also inquired about Reece's current emotional ratings, as well as any significant changes in thoughts, feelings or behavior since previous check in.  Chayla presented for session on time and was alert, oriented x5, with no evidence or self-report of active SI/HI or A/V H.  Aayliah reported compliance with medication and denied use of alcohol or illicit substances.  Berlynn reported scores of 2/10 for depression, 4/10 for anxiety, and 1/10 for anger/irritability.  Syniah denied any recent outbursts or panic attacks.  Shiquita reported that a recent success was cleaning and organizing the home a bit more, stating ?I also got outside and walked my dog?Marland Kitchen  Makelle denied any struggles over the past day.  Daiana reported that her goal this weekend will be to clean her car and organize pocketbooks.  ? ?Second Therapeutic Activity: Counselor invited members to participate in peaceful place guided imagery activity today.   Counselor explained how this is a powerful visualization tool which can aid in reducing stress while increasing sense of calm, control, and awareness if practiced regularly.  Counselor informed members beforehand that if they became uncomfortable at any point during activity, they could stop and open their eyes.  Counselor invited members to get comfortable, achieve a relaxing breathing rhythm, close their eyes, and then guided them through process of creating a 'peaceful place' which filled them with safety and calm.  Counselor encouraged members to include sensory details involving vision, sound, touch, smell, and taste which they considered pleasant to enhance experience.  Counselor invited members to share their opinion on the activity, including whether they were able to imagine a specific place, what details stood out to them, and how this made them feel during and after.  Intervention was effective, as evidenced by [place] successfully participating in activity, and reporting that she was able to visualize her peaceful place clearly, and imagined relaxing on a vast vacant beach, and feeling a gentle breeze, the sun on her skin, smell of salty air, and windchimes.  Mekesha reported that she would try to practice this in free time as part of developing self-care routine.   ? ?Assessment and Plan: ?Counselor recommends that Keyonda remain in IOP treatment to better manage mental health symptoms, ensure stability and pursue completion of treatment plan goals. Counselor recommends adherence to crisis/safety plan, taking medications as prescribed, and following up with medical professionals if any issues arise. ?  ?Follow Up Instructions: ?Counselor will send Webex link for next session. Tasheena was advised to call back or seek an in-person evaluation if the symptoms worsen or if the condition fails to improve as  anticipated. ?  ?Collaboration of Care:   Medication Management AEB Ricky Ala, NP  ?                                          Case Manager AEB Dellia Nims, CNA  ? ? ?Patient/Guardian was advised Release of Information must be obtained prior to any record release in order to collaborate their care with an outside provider. Patient/Guardian was advised if they have not already done so to contact the registration department to sign all necessary forms in order for [place] to release information regarding their care.  ? ?Consent: Patient/Guardian gives verbal consent for treatment and assignment of benefits for services provided during this visit. Patient/Guardian expressed understanding and agreed to proceed. ? ?I provided 165 minutes of non-face-to-face time during this encounter. ?  ?Shade Flood, LCSW, LCAS ?10/30/21  ?

## 2021-11-02 ENCOUNTER — Telehealth (INDEPENDENT_AMBULATORY_CARE_PROVIDER_SITE_OTHER): Payer: BC Managed Care – PPO | Admitting: Psychiatry

## 2021-11-02 ENCOUNTER — Encounter: Payer: Self-pay | Admitting: Psychiatry

## 2021-11-02 ENCOUNTER — Other Ambulatory Visit: Payer: Self-pay

## 2021-11-02 DIAGNOSIS — F331 Major depressive disorder, recurrent, moderate: Secondary | ICD-10-CM | POA: Diagnosis not present

## 2021-11-02 DIAGNOSIS — F411 Generalized anxiety disorder: Secondary | ICD-10-CM | POA: Diagnosis not present

## 2021-11-02 MED ORDER — VENLAFAXINE HCL ER 75 MG PO CP24: 225.0000 mg | ORAL_CAPSULE | Freq: Every day | ORAL | 1 refills | Status: DC

## 2021-11-02 MED ORDER — HYDROXYZINE PAMOATE 25 MG PO CAPS: 25.0000 mg | ORAL_CAPSULE | Freq: Every day | ORAL | 0 refills | Status: DC | PRN

## 2021-11-02 MED ORDER — BUPROPION HCL ER (XL) 150 MG PO TB24: 150.0000 mg | ORAL_TABLET | Freq: Every day | ORAL | 1 refills | Status: DC

## 2021-11-02 NOTE — Progress Notes (Signed)
Virtual Visit via Video Note ? ?I connected with Anita Nelson on @TODAY @ at  9:00 AM EDT by a video enabled telemedicine application and verified that I am speaking with the correct person using two identifiers. ? ?Location: ?Patient: at home ?Provider: at office ?  ?I discussed the limitations of evaluation and management by telemedicine and the availability of in person appointments. The patient expressed understanding and agreed to proceed. ? ?I discussed the assessment and treatment plan with the patient. The patient was provided an opportunity to ask questions and all were answered. The patient agreed with the plan and demonstrated an understanding of the instructions. ?  ?The patient was advised to call back or seek an in-person evaluation if the symptoms worsen or if the condition fails to improve as anticipated. ? ?I provided 20 minutes of non-face-to-face time during this encounter. ? ? ?[place], [place], [place] ? ? ?Patient ID: Anita Nelson, female   DOB: 10-21-1970, 51 y.o.   MRN: [id number] ?As per recent CCA states:  "Anita Nelson is 51 year old who presents to the ER after she was advised to do so by her PCP because of the increase symptoms of depression. Her PCP completed a referral for outpatient psychiatrist but wanted her to come to the ER to see if she can get something till she see's the outpatient provider. Patient states she has lost the motivation to do her daily activities and responsibilities. She's spending more time in the bed, having crying spells and missing work. She further reports her PCP change her medications approximately four to six months ago. She was taking Celexa and was switched to Effexor because she start having anxiety and panic attacks." ?Pt started MH-IOP today d/t worsening depression.  Pt reports she has had depression since age 30.  Been out of work since Feb. 6, 2023.  States her PCP wrote her out until Dec 07, 2021.  Stressors:  1) Financial  2) two adult kids  residing with her.  Reports conflict with daughter's boyfriend who comes into the home.  "My son is struggling to get a job."  3) Pain in back d/t arthritis.  Pt denies SI/HI or A/V hallucinations. ?Cc: chart for more hx ?Pt completed 10 days out of the 15 which were authorized.  11-02-21 was her original discharge date and she chose not to make the days up since she was feeling better.  "I feel more hopeful.  I feel better equipped.  It was great hearing others and sharing what we have in common.."  Pt states she's interested in signing up for groups through the [facility].  On a scale of 1-10 (10 being the worst); pt rates her depression at a 2 and anxiety at a 5.  Denies SI, HI or A/V hallucinations.  Pt states she still struggles with motivation at times. ?A:  D/C pt on 11-02-21.  Pt was advised of ROI must be obtained prior to any record release in order to collaborate her care with an outside provider.  Pt was advised if she hasn't done so to contact the registration dept to sign all necessary forms in order for MH-IOP to release info regarding her care.  Pt gives verbal consent for treatment and assignment of benefits for services provided during this telehealth group program.  Pt expressed understanding and agreed to proceed. ?Collaboration of Care:  Pt plans to f/u with Dr. Modesta Messing on 11-02-21 and a psychologist (pt couldn't recall his/her name or appt  date) in April.  RTW in May 2023 as per PCP/Dr. Modesta Messing.  Strongly encouraged groups through [facility].  R:  Pt receptive. ? ?Anita Nelson, M.Ed,CNA ?

## 2021-11-02 NOTE — Patient Instructions (Signed)
D:  Patient completed MH-IOP.  A:  Discharged on 11-02-21.  Follow up with Dr. Modesta Messing on 11-02-21.  Has appt with a psychologist in April.  Return to work in May 2023.  R:  Patient receptive. ?

## 2021-11-02 NOTE — Patient Instructions (Signed)
Continue venlafaxine 225 mg daily  ?Start bupropion 150 mg daily  ?Continue hydroxyzine 25 mg daily as needed for anxiety  ?Next appointment: 5/5 at 9:30, video ?

## 2021-11-03 ENCOUNTER — Other Ambulatory Visit: Payer: Self-pay

## 2021-11-03 ENCOUNTER — Encounter (HOSPITAL_COMMUNITY): Payer: Self-pay | Admitting: Family

## 2021-11-03 ENCOUNTER — Other Ambulatory Visit (HOSPITAL_COMMUNITY): Payer: BC Managed Care – PPO | Admitting: Family

## 2021-11-03 DIAGNOSIS — F331 Major depressive disorder, recurrent, moderate: Secondary | ICD-10-CM

## 2021-11-03 DIAGNOSIS — F411 Generalized anxiety disorder: Secondary | ICD-10-CM

## 2021-11-03 NOTE — Progress Notes (Signed)
Virtual Visit via Telephone Note ? ?I connected with Anita Nelson on 11/03/21 at  9:00 AM EDT by telephone and verified that I am speaking with the correct person using two identifiers. ? ?Location: ?Patient: Home ?Provider: Office ?  ?I discussed the limitations, risks, security and privacy concerns of performing an evaluation and management service by telephone and the availability of in person appointments. I also discussed with the patient that there may be a patient responsible charge related to this service. The patient expressed understanding and agreed to proceed. ? ? ?I discussed the assessment and treatment plan with the patient. The patient was provided an opportunity to ask questions and all were answered. The patient agreed with the plan and demonstrated an understanding of the instructions. ?  ?The patient was advised to call back or seek an in-person evaluation if the symptoms worsen or if the condition fails to improve as anticipated. ? ?I provided 15 minutes of non-face-to-face time during this encounter. ? ? ?Derrill Center, NP ? ? ?[facility] ?Intensive Outpatient Program ?Discharge Summary ? ?Anita Nelson ?[id number] ? ?Admission date: 10/13/2021 ?Discharge date: 11/02/2021 ? ?Reason for admission: Per admission assessment note:"Anita Nelson is a 51 year old African-American female who presents with worsening anxiety and depression.  States she is currently on short-term disability due to worsening symptoms.  States " I was having a mental breakdown" denied that she was taking any illicit drugs or has a history of substance abuse.  She reports increased anxiety and depression but she noticed a decline over the past 3 to 4 months prior to a referral by her primary care provider.  Reports feeling isolated, poor concentration and symptoms of worry. [name] denies suicidal or homicidal ideations denied auditory or visual hallucinations." ? ? ?Progress in Program Toward Treatment Goals:  Progressing-patient attended and participated with daily group session with active engaged participation.  She continues to deny suicidal or homicidal ideations.  Denies auditory visual hallucinations.  Patient was recently seen and evaluated by her primary psychiatrist where she reports she discontinued Seroquel and was initiated on hydroxyzine.  Which she reports tolerating better.  Denied any other safety concerns at discharge.  Support, encouragement and  reassurance was provided. ? ?Progress (rationale):  Keep follow-up with MD [place], and reported psychologist appointment in April. ? ? ?Take all medications as prescribed. ?Keep all follow-up appointments as scheduled.  ?Do not consume alcohol or use illegal drugs while on prescription medications. ?Report any adverse effects from your medications to your primary care provider promptly.  ?In the event of recurrent symptoms or worsening symptoms, call 911, a crisis hotline, or go to the nearest emergency department for evaluation.   ? ?Collaboration of Care: Medication Management AEB refilled Hydroxyzine at discharge ? ?Patient/Guardian was advised Release of Information must be obtained prior to any record release in order to collaborate their care with an outside provider. Patient/Guardian was advised if they have not already done so to contact the registration department to sign all necessary forms in order for [place] to release information regarding their care.  ? ?Consent: Patient/Guardian gives verbal consent for treatment and assignment of benefits for services provided during this visit. Patient/Guardian expressed understanding and agreed to proceed.  ? ?Ricky Ala NP  ?11/03/2021 ?

## 2021-11-04 ENCOUNTER — Ambulatory Visit (HOSPITAL_COMMUNITY): Payer: BC Managed Care – PPO

## 2021-11-05 ENCOUNTER — Ambulatory Visit (HOSPITAL_COMMUNITY): Payer: BC Managed Care – PPO

## 2021-11-05 ENCOUNTER — Telehealth: Payer: Self-pay | Admitting: Psychiatry

## 2021-11-05 NOTE — Telephone Encounter (Signed)
Please obtain consent form for release of info to allow this, and send required items to them.

## 2021-11-10 ENCOUNTER — Ambulatory Visit (INDEPENDENT_AMBULATORY_CARE_PROVIDER_SITE_OTHER): Payer: BC Managed Care – PPO | Admitting: Psychologist

## 2021-11-10 DIAGNOSIS — F331 Major depressive disorder, recurrent, moderate: Secondary | ICD-10-CM

## 2021-11-10 NOTE — Plan of Care (Signed)

## 2021-11-10 NOTE — Progress Notes (Signed)
                Mack Alvidrez, PsyD 
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
## 2021-11-16 ENCOUNTER — Ambulatory Visit: Payer: Self-pay | Admitting: Licensed Clinical Social Worker

## 2021-11-16 NOTE — Progress Notes (Signed)
Pt reported that she has a psychologist and doesn't know why this appt was scheduled. ? ?Informed pt that appointment would be cancelled with no "no show" fees for cancelling at the time of service. ?

## 2021-11-17 ENCOUNTER — Ambulatory Visit: Payer: BC Managed Care – PPO | Admitting: Psychologist

## 2021-11-19 ENCOUNTER — Ambulatory Visit: Payer: BC Managed Care – PPO

## 2021-12-09 NOTE — Progress Notes (Signed)
Virtual Visit via Video Note ? ?I connected with Anita Nelson on 12/11/21 at  9:30 AM EDT by a video enabled telemedicine application and verified that I am speaking with the correct person using two identifiers. ? ?Location: ?Patient: home ?Provider: office ? ?  ?I discussed the limitations of evaluation and management by telemedicine and the availability of in person appointments. The patient expressed understanding and agreed to proceed. ?  ?I discussed the assessment and treatment plan with the patient. The patient was provided an opportunity to ask questions and all were answered. The patient agreed with the plan and demonstrated an understanding of the instructions. ?  ?The patient was advised to call back or seek an in-person evaluation if the symptoms worsen or if the condition fails to improve as anticipated. ? ?I provided 12 minutes of non-face-to-face time during this encounter. ? ? ?Norman Clay, MD ? ? ? ?BH MD/PA/NP OP Progress Note ? ?12/11/2021 9:57 AM ?Anita Nelson  ?MRN:  [id number] ? ?Chief Complaint:  ?Chief Complaint  ?Patient presents with  ? Follow-up  ? Depression  ? ?HPI:  ?This is a follow-up appointment for depression and anxiety.  ?She states that she has started work this week.  She has been feeling stressed, talking with customers.  She is planning to apply for another job, which allows to work from home.  She finds group to be very helpful.  She likes that people understands her well.  It has been easier for her to do morning routine since starting bupropion, and she likes this medication.  She feels more motivated.  She enjoys taking care of 35 plants.  She also enjoys taking care of her Husky, who she has for the past two months.  Although she feels depressed at times, it is not constant anymore.  She has been able to eat more, including breakfast.  She has better focus.  She denies SI.  She continues to have anxiety, although it has been improving.  She takes hydroxyzine every day.   She denies panic attacks.  She feels comfortable to stay on the current medication regimen.  ? ? ?Wt Readings from Last 3 Encounters:  ?09/25/21 132 lb 0.9 oz (59.9 kg)  ?04/24/21 132 lb (59.9 kg)  ?10/29/20 135 lb (61.2 kg)  ?  ? ?Daily routine: stays at home ?Exercise: none ?Support:best friend ?Household: son (66 yo), daughter (28 yo) ?Marital status:single.  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 Hb wnl, TSH wnl ?  ?Visit Diagnosis:  ?  ICD-10-CM   ?1. Mild episode of recurrent major depressive disorder ([place])  F33.0   ?  ?2. Anxiety state  F41.1   ?  ? ? ?Past Psychiatric History: Please see initial evaluation for full details. I have reviewed the history. No updates at this time.  ?  ? ?Past Medical History:  ?Past Medical History:  ?Diagnosis Date  ? Anemia   ? Anxiety   ? BRCA negative 10/2017  ? MyRisk neg except VUS in AXIN2, MSH3, RNF43  ? Depression   ? Family history of breast cancer   ? Family history of ovarian cancer   ? GERD (gastroesophageal reflux disease)   ? Increased risk of breast cancer 10/2017  ? IBIS=25%  ? Migraine   ? Septal defect, heart 2016  ? Dr. Saralyn Pilar septal hypokinesis  ? Shortness of breath dyspnea   ?  with activity  ?  ?Past Surgical History:  ?Procedure Laterality Date  ? BREAST BIOPSY Right 03-25-14  ? Stromal fibrosis and benign calcifications  ? BREAST BIOPSY Left 02/28/2015  ? Procedure: BREAST BIOPSY WITH NEEDLE LOCALIZATION;  Surgeon: Jeffrey W Byrnett, MD;  Location: [facility] ORS;  Service: General;  Laterality: Left;  ? BREAST BIOPSY Right 03/29/2018  ? affirm bx right x clip   path pending  ? BREAST EXCISIONAL BIOPSY Left 2016  ? benign  ? ENDOMETRIAL ABLATION    ? TUBAL LIGATION  2007  ? ? ?Family Psychiatric History: Please see initial evaluation for full details. I have reviewed the history. No updates at this time.  ?  ? ?Family History:  ?Family History   ?Problem Relation Age of Onset  ? Anxiety disorder Mother   ? Breast cancer Mother 41  ? Hypertension Father   ? Post-traumatic stress disorder Father   ? Ovarian cancer Paternal Grandmother   ? ? ?Social History:  ?Social History  ? ?Socioeconomic History  ? Marital status: Single  ?  Spouse name: Not on file  ? Number of children: Not on file  ? Years of education: Not on file  ? Highest education level: Not on file  ?Occupational History  ? Not on file  ?Tobacco Use  ? Smoking status: Every Day  ?  Packs/day: 1.00  ?  Years: 25.00  ?  Pack years: 25.00  ?  Types: Cigarettes  ? Smokeless tobacco: Never  ?Vaping Use  ? Vaping Use: Never used  ?Substance and Sexual Activity  ? Alcohol use: No  ? Drug use: No  ? Sexual activity: Not Currently  ?  Birth control/protection: Surgical  ?Other Topics Concern  ? Not on file  ?Social History Narrative  ? Not on file  ? ?Social Determinants of Health  ? ?Financial Resource Strain: Not on file  ?Food Insecurity: Not on file  ?Transportation Needs: Not on file  ?Physical Activity: Not on file  ?Stress: Not on file  ?Social Connections: Not on file  ? ? ?Allergies: No Known Allergies ? ?Metabolic Disorder Labs: ?No results found for: HGBA1C, MPG ?No results found for: PROLACTIN ?No results found for: CHOL, TRIG, HDL, CHOLHDL, VLDL, LDLCALC ?Lab Results  ?Component Value Date  ? TSH 1.270 10/11/2017  ? ? ?Therapeutic Level Labs: ?No results found for: LITHIUM ?No results found for: VALPROATE ?No components found for:  CBMZ ? ?Current Medications: ?Current Outpatient Medications  ?Medication Sig Dispense Refill  ? [START ON 01/02/2022] buPROPion (WELLBUTRIN XL) 150 MG 24 hr tablet Take 1 tablet (150 mg total) by mouth daily. 30 tablet 0  ? celecoxib (CELEBREX) 400 MG capsule Take 400 mg by mouth in the morning and at bedtime.    ? estradiol-norethindrone (COMBIPATCH) 0.05-0.25 MG/DAY Place 1 patch onto the skin 2 (two) times a week. 24 patch 3  ? [START ON 12/22/2021] hydrOXYzine  (VISTARIL) 25 MG capsule Take 1 capsule (25 mg total) by mouth daily as needed for anxiety. 30 capsule 2  ? SUMAtriptan (IMITREX) 100 MG tablet Take 1 tablet (100 mg total) by mouth every 2 (two) hours as needed for migraine. May repeat in 2 hours if headache persists or recurs. 10 tablet 0  ? [START ON 01/07/2022] venlafaxine XR (EFFEXOR-XR) 75 MG 24 hr capsule Take 3 capsules (225 mg total) by mouth daily. 90 capsule 2  ? ?No current facility-administered medications for this visit.  ? ? ? ?Musculoskeletal: ?Strength & Muscle Tone:    N/A ?Gait & Station:  N/A ?Patient leans: N/A ? ?Psychiatric Specialty Exam: ?Review of Systems  ?Psychiatric/Behavioral:  Positive for dysphoric mood. Negative for agitation, behavioral problems, confusion, decreased concentration, hallucinations, self-injury, sleep disturbance and suicidal ideas. The patient is nervous/anxious. The patient is not hyperactive.   ?All other systems reviewed and are negative.  ?There were no vitals taken for this visit.There is no height or weight on file to calculate BMI.  ?General Appearance: Fairly Groomed  ?Eye Contact:  Good  ?Speech:  Clear and Coherent  ?Volume:  Normal  ?Mood:   better  ?Affect:  Appropriate, Congruent, and calm  ?Thought Process:  Coherent  ?Orientation:  Full (Time, Place, and Person)  ?Thought Content: Logical   ?Suicidal Thoughts:  No  ?Homicidal Thoughts:  No  ?Memory:  Immediate;   Good  ?Judgement:  Good  ?Insight:  Good  ?Psychomotor Activity:  Normal  ?Concentration:  Concentration: Good and Attention Span: Good  ?Recall:  Good  ?Fund of Knowledge: Good  ?Language: Good  ?Akathisia:  No  ?Handed:  Right  ?AIMS (if indicated): not done  ?Assets:  Communication Skills ?Desire for Improvement  ?ADL's:  Intact  ?Cognition: WNL  ?Sleep:  Fair  ? ?Screenings: ?PHQ2-9   ? ?Flowsheet Row Counselor from 10/13/2021 in [place] Video Visit from 10/07/2021 in [place]  ?PHQ-2 Total  Score 6 5  ?PHQ-9 Total Score 24 15  ? ?  ? ?Flowsheet Row Counselor from 10/13/2021 in [place] Video Visit from 10/07/2021 in [place] ED from 2

## 2021-12-11 ENCOUNTER — Encounter: Payer: Self-pay | Admitting: Psychiatry

## 2021-12-11 ENCOUNTER — Telehealth (INDEPENDENT_AMBULATORY_CARE_PROVIDER_SITE_OTHER): Payer: BC Managed Care – PPO | Admitting: Psychiatry

## 2021-12-11 DIAGNOSIS — F330 Major depressive disorder, recurrent, mild: Secondary | ICD-10-CM

## 2021-12-11 DIAGNOSIS — F411 Generalized anxiety disorder: Secondary | ICD-10-CM | POA: Diagnosis not present

## 2021-12-11 MED ORDER — HYDROXYZINE PAMOATE 25 MG PO CAPS: 25.0000 mg | ORAL_CAPSULE | Freq: Every day | ORAL | 2 refills | Status: AC | PRN

## 2021-12-11 MED ORDER — BUPROPION HCL ER (XL) 150 MG PO TB24: 150.0000 mg | ORAL_TABLET | Freq: Every day | ORAL | 0 refills | Status: AC

## 2021-12-11 MED ORDER — VENLAFAXINE HCL ER 75 MG PO CP24: 225.0000 mg | ORAL_CAPSULE | Freq: Every day | ORAL | 2 refills | Status: AC

## 2021-12-11 NOTE — Patient Instructions (Signed)
Continue venlafaxine 225 mg daily  ?Start bupropion 150 mg daily  ?Continue hydroxyzine 25 mg daily as needed for anxiety  ?Next appointment: 6/16 at 9:30  ?
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
## 2021-12-22 ENCOUNTER — Telehealth: Payer: BC Managed Care – PPO | Admitting: Psychiatry

## 2021-12-22 ENCOUNTER — Telehealth: Payer: Self-pay | Admitting: Psychiatry

## 2021-12-22 NOTE — Telephone Encounter (Signed)
Sent link for video visit through [place]. Patient did not sign in. Called the patient for appointment scheduled today. The patient did not answer the phone. Left voice message to contact the office [phone]).   ?

## 2021-12-24 ENCOUNTER — Ambulatory Visit: Payer: BC Managed Care – PPO | Admitting: Psychologist
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
## 2021-12-29 ENCOUNTER — Telehealth (INDEPENDENT_AMBULATORY_CARE_PROVIDER_SITE_OTHER): Payer: BC Managed Care – PPO | Admitting: Psychiatry

## 2021-12-29 ENCOUNTER — Encounter: Payer: Self-pay | Admitting: Psychiatry

## 2021-12-29 DIAGNOSIS — F330 Major depressive disorder, recurrent, mild: Secondary | ICD-10-CM

## 2021-12-29 DIAGNOSIS — F411 Generalized anxiety disorder: Secondary | ICD-10-CM

## 2021-12-29 MED ORDER — BUPROPION HCL ER (XL) 300 MG PO TB24: 300.0000 mg | ORAL_TABLET | Freq: Every day | ORAL | 0 refills | Status: DC

## 2021-12-29 NOTE — Patient Instructions (Signed)
Continue venlafaxine 225 mg daily  Increase bupropion 300 mg daily  Continue hydroxyzine 25 mg daily as needed for anxiety  Next appointment: 6/28 at 3:30

## 2022-01-22 ENCOUNTER — Telehealth: Payer: BC Managed Care – PPO | Admitting: Psychiatry
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
## 2022-02-01 ENCOUNTER — Other Ambulatory Visit: Payer: Self-pay | Admitting: Psychiatry

## 2022-02-03 ENCOUNTER — Telehealth: Payer: BC Managed Care – PPO | Admitting: Psychiatry

## 2022-02-03 ENCOUNTER — Telehealth: Payer: Self-pay | Admitting: Psychiatry

## 2022-02-03 NOTE — Telephone Encounter (Signed)
Sent link for video visit through [place]. Patient did not sign in. Called the patient for appointment scheduled today. The patient did not answer the phone. Left voice message to contact the office [phone]).

## 2022-04-14 DIAGNOSIS — G8929 Other chronic pain: Secondary | ICD-10-CM | POA: Diagnosis not present

## 2022-04-14 DIAGNOSIS — Z79899 Other long term (current) drug therapy: Secondary | ICD-10-CM | POA: Diagnosis not present

## 2022-04-14 DIAGNOSIS — E7800 Pure hypercholesterolemia, unspecified: Secondary | ICD-10-CM | POA: Diagnosis not present

## 2022-04-14 DIAGNOSIS — R519 Headache, unspecified: Secondary | ICD-10-CM | POA: Diagnosis not present

## 2022-04-14 DIAGNOSIS — F329 Major depressive disorder, single episode, unspecified: Secondary | ICD-10-CM | POA: Diagnosis not present

## 2022-04-14 DIAGNOSIS — R69 Illness, unspecified: Secondary | ICD-10-CM | POA: Diagnosis not present

## 2022-04-14 DIAGNOSIS — G43109 Migraine with aura, not intractable, without status migrainosus: Secondary | ICD-10-CM | POA: Diagnosis not present

## 2022-09-22 ENCOUNTER — Other Ambulatory Visit: Payer: Self-pay | Admitting: Psychiatry

## 2022-10-17 IMAGING — MG MM DIGITAL DIAGNOSTIC UNILAT*L* W/ TOMO W/ CAD
6 series · 6 of 18 positions shown · non-contrast
Comparison: Previous exam(s).

CLINICAL DATA: 50-year-old female presenting as a recall from
screening for possible left breast asymmetry.

EXAM:
DIGITAL DIAGNOSTIC UNILATERAL LEFT MAMMOGRAM WITH TOMOSYNTHESIS AND
CAD
TECHNIQUE: Left digital diagnostic mammography and breast tomosynthesis was
performed. The images were evaluated with computer-aided detection.

[L ML synth-2D (1 of 2)]
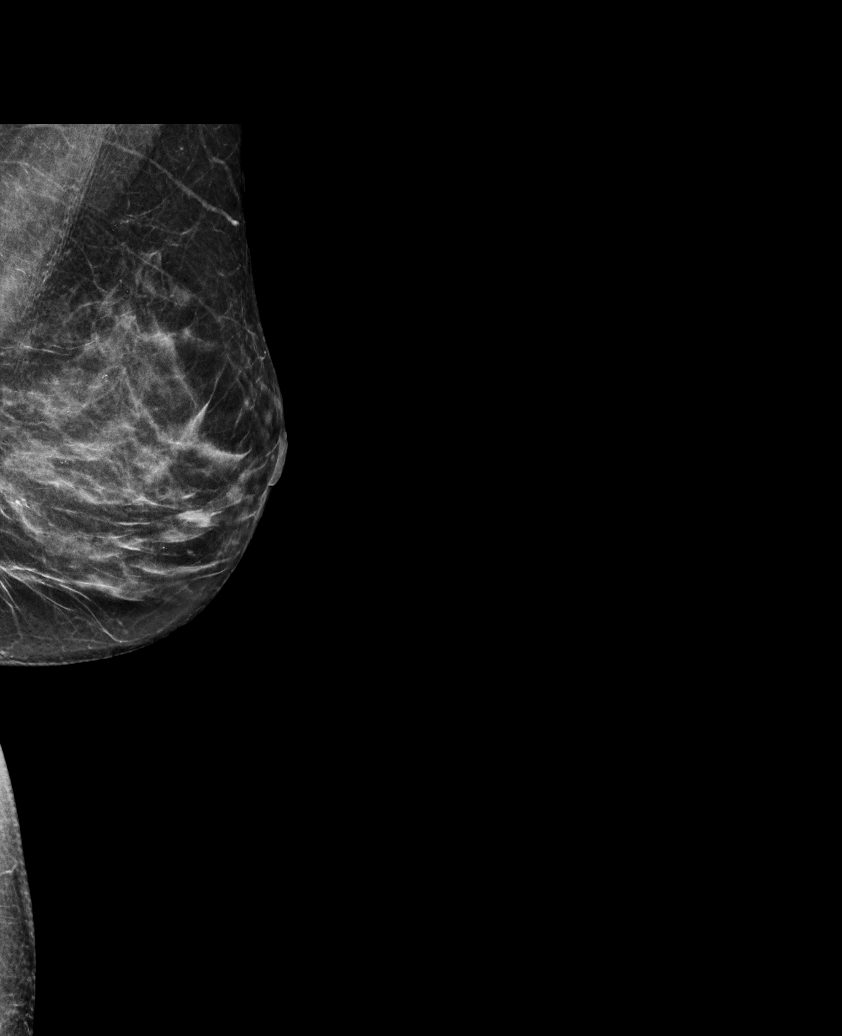

[L CC synth-2D]
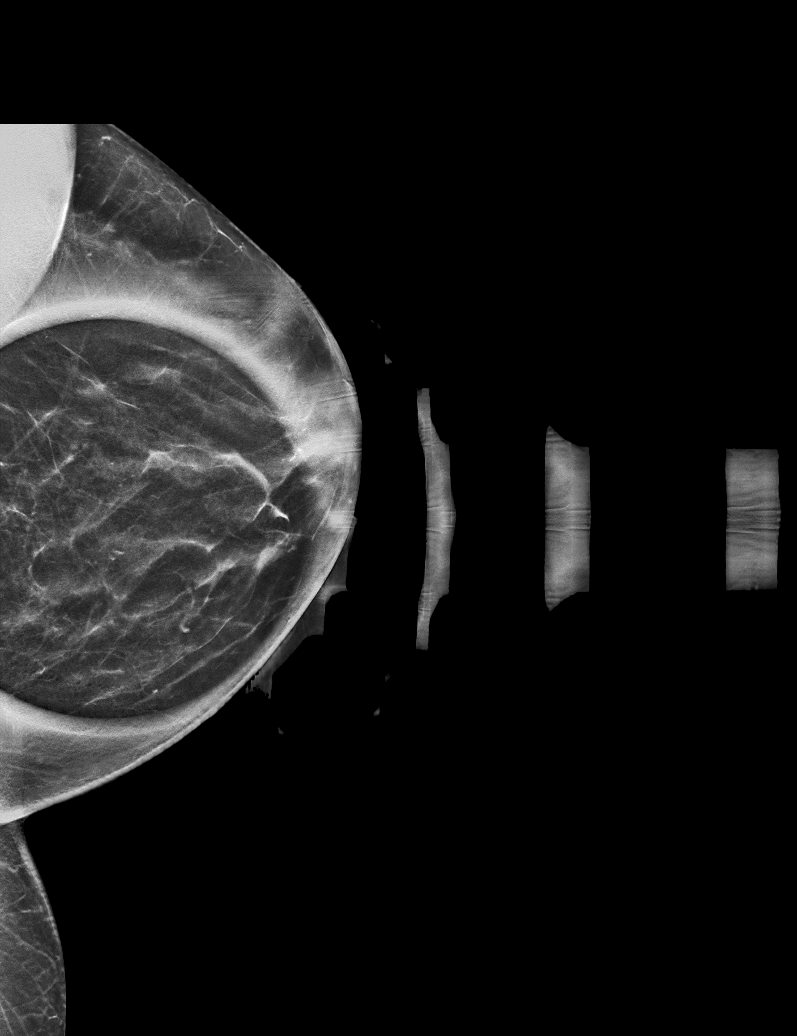

[L ML synth-2D (2 of 2)]
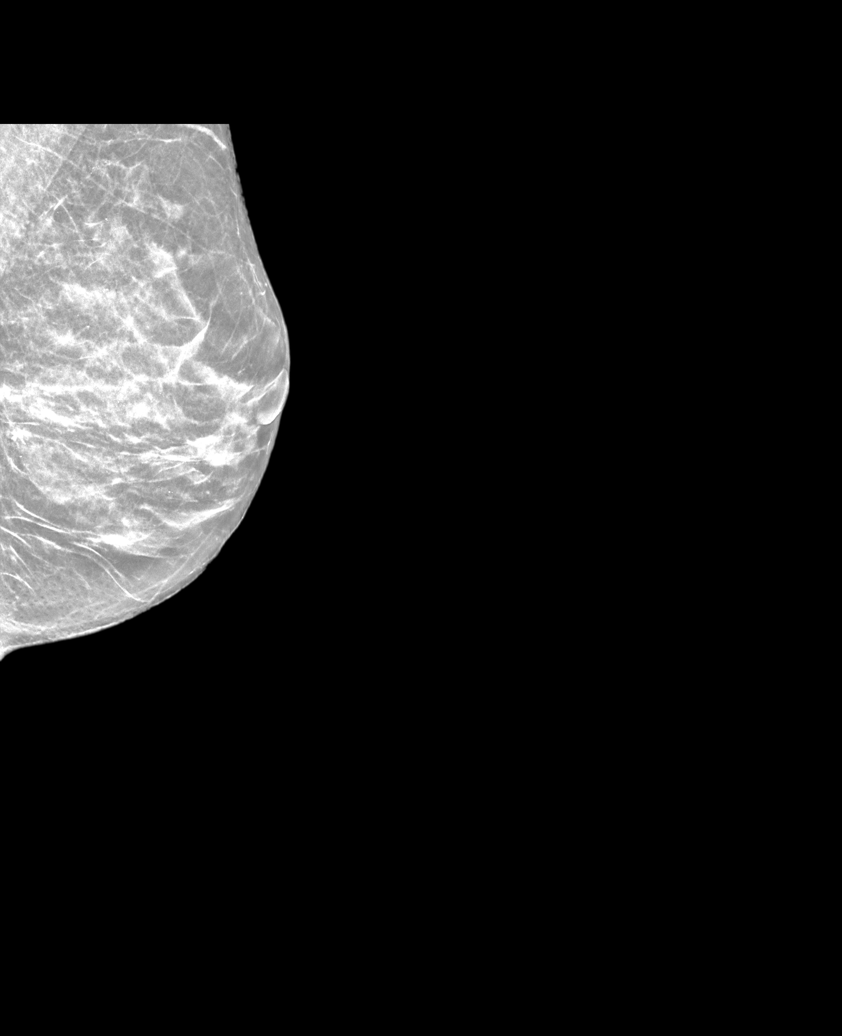

[L ML tomo (1 of 2) · tomo slice 27/54.0]
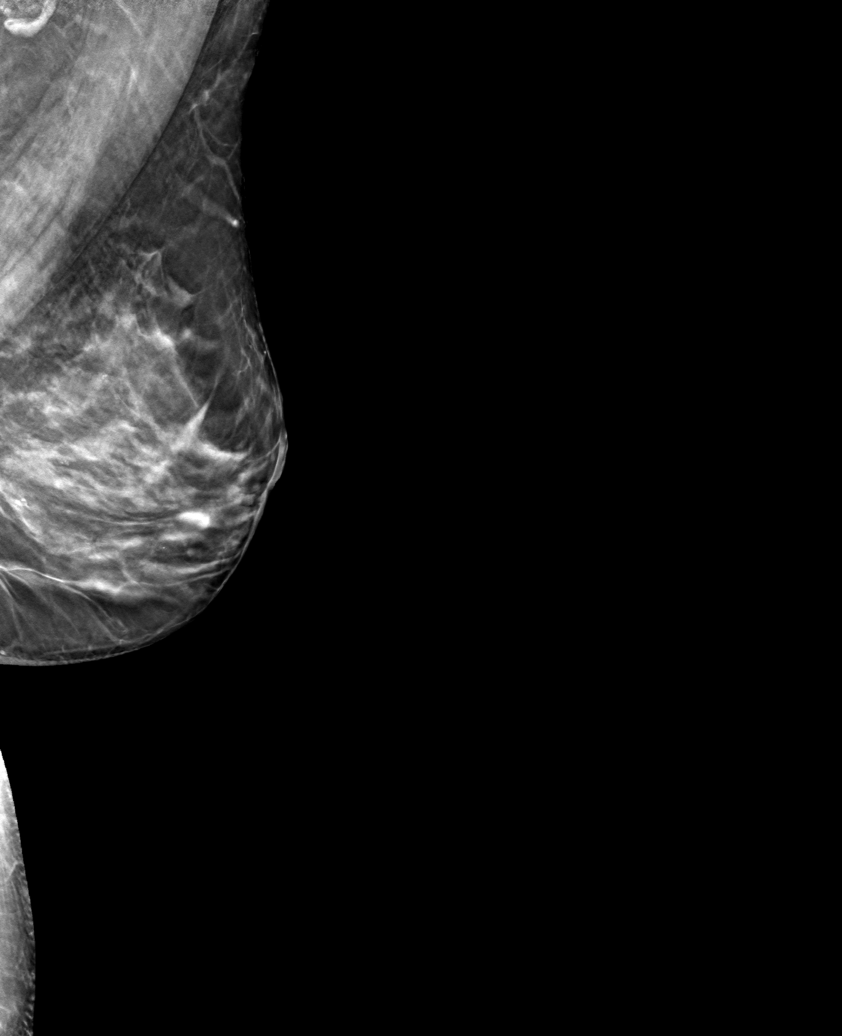

[L ML tomo (2 of 2) · tomo slice 24/47.0]
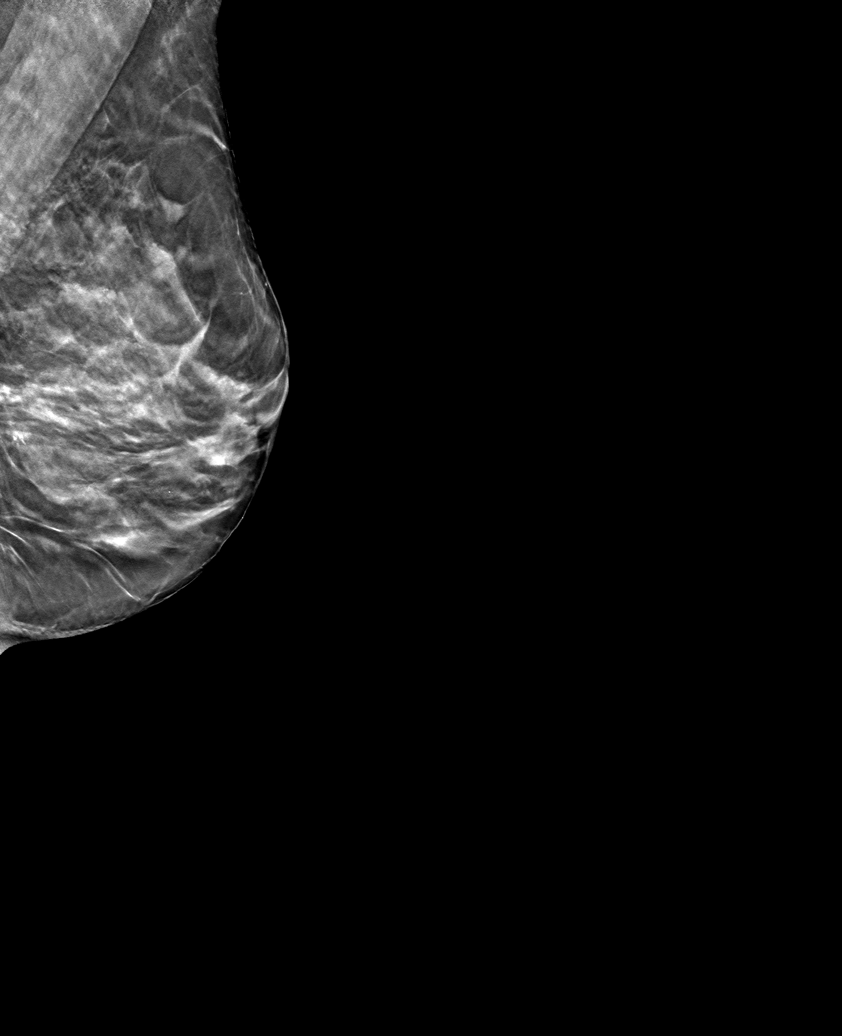

[L CC tomo · tomo slice 25/49.0]
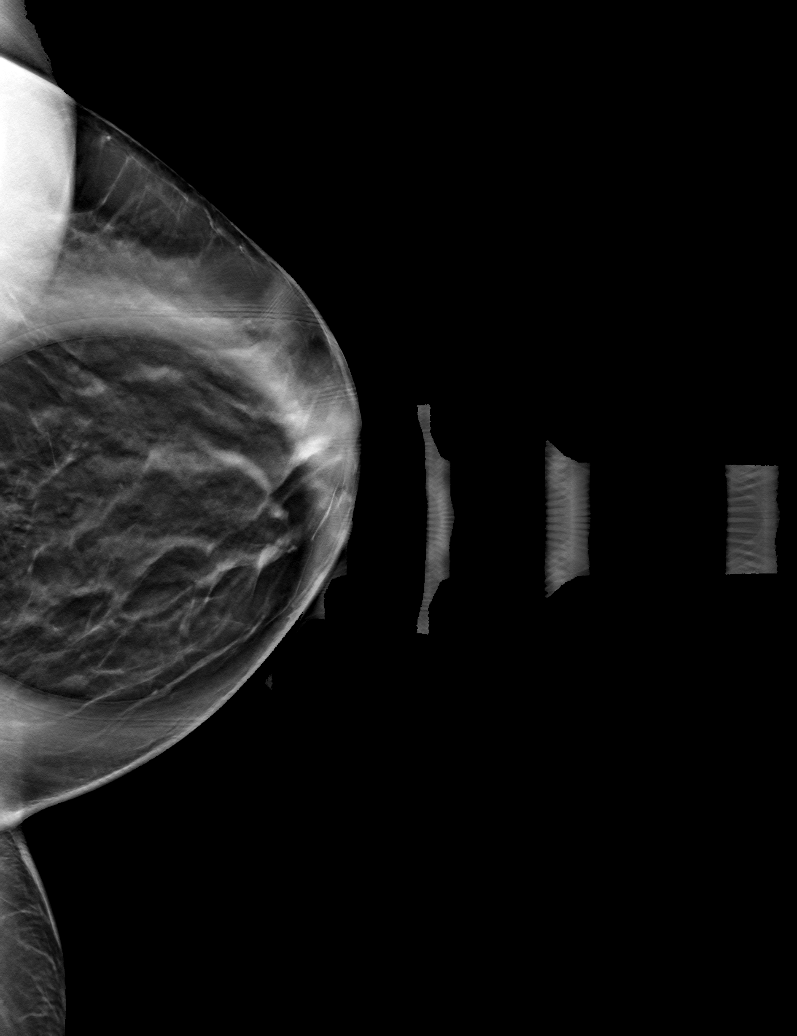

[6 of 18 positions shown; findings below may reference images not displayed]

ACR Breast Density Category c: The breast tissue is heterogeneously
dense, which may obscure small masses.
FINDINGS: Spot compression tomosynthesis cc and full field mL tomosynthesis
views of the left breast were performed for a questioned asymmetry
seen only on CC view in the medial breast. On the additional imaging
the tissue in this area disperses without persistent asymmetry,
mass, or distortion. There are no new suspicious findings elsewhere
in the left breast.
IMPRESSION: Resolution of the questioned asymmetry in the medial left breast.

RECOMMENDATION:
1.  Screening mammogram in one year.(Code:FJ-Y-OJC)

2. Consider risk assessment to determine the patient's lifetime risk
of breast cancer given her family history, if this has not already
been performed. Per American Cancer Society guidelines, if the
patient has a calculated lifetime risk of developing breast cancer
of greater than 20%, annual screening MRI of the breasts would be
recommended at the time of screening mammography.

I have discussed the findings and recommendations with the patient.
If applicable, a reminder letter will be sent to the patient
regarding the next appointment.

BI-RADS CATEGORY  1: Negative.

## 2023-05-25 ENCOUNTER — Other Ambulatory Visit: Payer: Self-pay | Admitting: Internal Medicine

## 2023-05-25 DIAGNOSIS — M5416 Radiculopathy, lumbar region: Secondary | ICD-10-CM

## 2023-06-01 ENCOUNTER — Other Ambulatory Visit: Payer: Self-pay | Admitting: Internal Medicine

## 2023-06-01 DIAGNOSIS — M5416 Radiculopathy, lumbar region: Secondary | ICD-10-CM

## 2023-06-17 ENCOUNTER — Ambulatory Visit
Admission: RE | Admit: 2023-06-17 | Discharge: 2023-06-17 | Disposition: A | Discharge status: Home / Self Care | Payer: 59 | Source: Ambulatory Visit | Attending: Internal Medicine | Admitting: Internal Medicine

## 2023-06-17 DIAGNOSIS — M5416 Radiculopathy, lumbar region: Secondary | ICD-10-CM
